# Patient Record
Sex: Female | Born: 1950
Health system: Southern US, Community
[De-identification: ages and names within clinical notes are randomized; demographics above are authoritative.]

## PROBLEM LIST (undated history)

## (undated) DIAGNOSIS — M8580 Other specified disorders of bone density and structure, unspecified site: Secondary | ICD-10-CM

## (undated) DIAGNOSIS — F419 Anxiety disorder, unspecified: Secondary | ICD-10-CM

## (undated) DIAGNOSIS — R42 Dizziness and giddiness: Secondary | ICD-10-CM

## (undated) DIAGNOSIS — I509 Heart failure, unspecified: Secondary | ICD-10-CM

## (undated) DIAGNOSIS — I493 Ventricular premature depolarization: Secondary | ICD-10-CM

## (undated) DIAGNOSIS — I5022 Chronic systolic (congestive) heart failure: Secondary | ICD-10-CM

## (undated) DIAGNOSIS — J302 Other seasonal allergic rhinitis: Secondary | ICD-10-CM

## (undated) DIAGNOSIS — J449 Chronic obstructive pulmonary disease, unspecified: Secondary | ICD-10-CM

## (undated) DIAGNOSIS — K219 Gastro-esophageal reflux disease without esophagitis: Secondary | ICD-10-CM

## (undated) DIAGNOSIS — E079 Disorder of thyroid, unspecified: Secondary | ICD-10-CM

## (undated) DIAGNOSIS — F32A Depression, unspecified: Secondary | ICD-10-CM

## (undated) DIAGNOSIS — A159 Respiratory tuberculosis unspecified: Secondary | ICD-10-CM

## (undated) DIAGNOSIS — I011 Acute rheumatic endocarditis: Secondary | ICD-10-CM

## (undated) DIAGNOSIS — I10 Essential (primary) hypertension: Secondary | ICD-10-CM

## (undated) DIAGNOSIS — F329 Major depressive disorder, single episode, unspecified: Secondary | ICD-10-CM

## (undated) HISTORY — PX: BACK SURGERY: SHX140

## (undated) HISTORY — PX: APPENDECTOMY: SHX54

## (undated) HISTORY — PX: ABDOMINAL HYSTERECTOMY: SHX81

## (undated) HISTORY — PX: TONSILLECTOMY: SUR1361

---
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
## 2009-09-03 ENCOUNTER — Emergency Department: Payer: Self-pay | Admitting: Emergency Medicine

## 2017-05-11 HISTORY — PX: CARDIAC CATHETERIZATION: SHX172

## 2018-03-12 HISTORY — PX: CARDIAC ELECTROPHYSIOLOGY MAPPING AND ABLATION: SHX1292

## 2019-08-27 ENCOUNTER — Other Ambulatory Visit: Payer: Self-pay

## 2019-08-27 ENCOUNTER — Encounter: Payer: Self-pay | Admitting: Emergency Medicine

## 2019-08-27 ENCOUNTER — Ambulatory Visit
Admission: EM | Admit: 2019-08-27 | Discharge: 2019-08-27 | Disposition: A | Discharge status: Home / Self Care | Payer: Medicare HMO | Attending: Family Medicine | Admitting: Family Medicine

## 2019-08-27 DIAGNOSIS — H6021 Malignant otitis externa, right ear: Secondary | ICD-10-CM

## 2019-08-27 HISTORY — DX: Other specified disorders of bone density and structure, unspecified site: M85.80

## 2019-08-27 HISTORY — DX: Depression, unspecified: F32.A

## 2019-08-27 HISTORY — DX: Other seasonal allergic rhinitis: J30.2

## 2019-08-27 HISTORY — DX: Chronic systolic (congestive) heart failure: I50.22

## 2019-08-27 HISTORY — DX: Anxiety disorder, unspecified: F41.9

## 2019-08-27 HISTORY — DX: Chronic obstructive pulmonary disease, unspecified: J44.9

## 2019-08-27 HISTORY — DX: Acute rheumatic endocarditis: I01.1

## 2019-08-27 HISTORY — DX: Ventricular premature depolarization: I49.3

## 2019-08-27 HISTORY — DX: Disorder of thyroid, unspecified: E07.9

## 2019-08-27 MED ORDER — NEOMYCIN-POLYMYXIN-HC 3.5-10000-1 OT SUSP: 4.0000 [drp] | Freq: Three times a day (TID) | OTIC | 0 refills | Status: DC

## 2019-08-27 NOTE — ED Triage Notes (Signed)
Patient in today c/o right ear itchy rash x 1 week. Patient has been using Bactroban ointment to the rash.

## 2019-08-29 NOTE — ED Provider Notes (Signed)
[facility]    CSN: [id number] Arrival date & time: 08/27/19  1711      History   Chief Complaint Chief Complaint  Patient presents with  . Ear Problem    HPI Maureen Hampton is a 68 y.o. female.   68 yo female with a c/o right ear discomfort, pain and drainage for the past week. Denies any fevers, chills, injuries.      Past Medical History:  Diagnosis Date  . Anxiety   . COPD (chronic obstructive pulmonary disease) (HCC)   . Depression   . Osteopenia   . PVC (premature ventricular contraction)   . Rheumatoid aortitis   . Seasonal allergies   . Systolic heart failure, chronic (HCC)   . Thyroid disease     There are no active problems to display for this patient.   Past Surgical History:  Procedure Laterality Date  . ABDOMINAL HYSTERECTOMY    . APPENDECTOMY    . BACK SURGERY    . CARDIAC ELECTROPHYSIOLOGY MAPPING AND ABLATION    . TONSILLECTOMY      OB History   No obstetric history on file.      Home Medications    Prior to Admission medications   Medication Sig Start Date End Date Taking? Authorizing Provider  albuterol (VENTOLIN HFA) 108 (90 Base) MCG/ACT inhaler Inhale into the lungs. 09/24/18  Yes [provider]  alendronate (FOSAMAX) 70 MG tablet Take by mouth. 07/01/18  Yes [provider]  baclofen (LIORESAL) 10 MG tablet TAKE 1 TABLET THREE TIMES DAILY AS NEEDED FOR MUSCLE SPASM(S) 07/27/19  Yes [provider]  cetirizine (ZYRTEC) 10 MG tablet Take by mouth. 04/01/19 03/31/20 Yes [provider]  diclofenac sodium (VOLTAREN) 1 % GEL Apply topically. 09/02/18 09/02/19 Yes [provider]  fluticasone (FLONASE) 50 MCG/ACT nasal spray Frequency:PRN   Dosage:0.0     Instructions:  Note:Dose: 2SPRY EACH NOSTIRL 04/23/11  Yes [provider]  folic acid (FOLVITE) 1 MG tablet Take by mouth. 09/02/18  Yes [provider]  furosemide (LASIX) 20 MG tablet Take by mouth. 07/09/18  Yes  [provider]  gabapentin (NEURONTIN) 300 MG capsule TAKE 2 CAPSULES EVERY NIGHT 03/11/19  Yes [provider]  levothyroxine (SYNTHROID) 112 MCG tablet Take by mouth. 06/18/19 12/15/19 Yes [provider]  lisinopril (ZESTRIL) 5 MG tablet TAKE 1 TABLET BY MOUTH EVERY DAY 07/27/19  Yes [provider]  methotrexate (RHEUMATREX) 2.5 MG tablet TAKE 8 TABLETS ONE TIME WEEKLY 01/15/19  Yes [provider]  metoprolol succinate (TOPROL-XL) 25 MG 24 hr tablet Take by mouth. 12/03/18 12/03/19 Yes [provider]  omeprazole (PRILOSEC) 20 MG capsule TAKE 1 CAPSULE EVERY DAY 03/11/19  Yes [provider]  sertraline (ZOLOFT) 100 MG tablet TAKE 1 AND 1/2 TABLETS EVERY DAY 03/11/19  Yes [provider]  Spacer/Aero-Holding Chambers (AEROCHAMBER MV) inhaler by Does not apply route. 11/19/18  Yes [provider]  budesonide-formoterol (SYMBICORT) 160-4.5 MCG/ACT inhaler Inhale into the lungs.    [provider]  neomycin-polymyxin-hydrocortisone (CORTISPORIN) 3.5-10000-1 OTIC suspension Place 4 drops into the right ear 3 (three) times daily. 08/27/19   Payton Mccallum, MD    Family History Family History  Family history unknown: Yes    Social History Social History   Tobacco Use  . Smoking status: Former Smoker    Quit date: 08/26/1994    Years since quitting: 25.0  . Smokeless tobacco: Never Used  Substance Use Topics  .  Alcohol use: Never    Frequency: Never  . Drug use: Never     Allergies   Penicillins and Codeine   Review of Systems Review of Systems   Physical Exam Triage Vital Signs ED Triage Vitals  Enc Vitals Group     BP 08/27/19 1726 (!) 120/47     Pulse Rate 08/27/19 1726 (!) 47     Resp 08/27/19 1726 18     Temp 08/27/19 1726 98.2 F (36.8 C)     Temp Source 08/27/19 1726 Oral     SpO2 08/27/19 1726 98 %     Weight 08/27/19 1725 146 lb (66.2 kg)     Height 08/27/19 1725 5\' 5"  (1.651 m)      Head Circumference --      Peak Flow --      Pain Score 08/27/19 1725 7     Pain Loc --      Pain Edu? --      Excl. in [place]? --    No data found.  Updated Vital Signs BP (!) 120/47 (BP Location: Left Arm)   Pulse (!) 47   Temp 98.2 F (36.8 C) (Oral)   Resp 18   Ht 5\' 5"  (1.651 m)   Wt 66.2 kg   SpO2 98%   BMI 24.30 kg/m   Visual Acuity Right Eye Distance:   Left Eye Distance:   Bilateral Distance:    Right Eye Near:   Left Eye Near:    Bilateral Near:     Physical Exam Vitals signs and nursing note reviewed.  Constitutional:      General: She is not in acute distress.    Appearance: She is not toxic-appearing or diaphoretic.  HENT:     Right Ear: Drainage and swelling present.     Left Ear: Tympanic membrane, ear canal and external ear normal.     Ears:     Comments: Right ear canal swollen and erythematous with drainage Neurological:     Mental Status: She is alert.      UC Treatments / Results  Labs (all labs ordered are listed, but only abnormal results are displayed) Labs Reviewed - No data to display  EKG   Radiology No results found.  Procedures Procedures (including critical care time)  Medications Ordered in UC Medications - No data to display  Initial Impression / Assessment and Plan / UC Course  I have reviewed the triage vital signs and the nursing notes.  Pertinent labs & imaging results that were available during my care of the patient were reviewed by me and considered in my medical decision making (see chart for details).      Final Clinical Impressions(s) / UC Diagnoses   Final diagnoses:  Acute malignant otitis externa of right ear    ED Prescriptions    Medication Sig Dispense Auth. Provider   neomycin-polymyxin-hydrocortisone (CORTISPORIN) 3.5-10000-1 OTIC suspension Place 4 drops into the right ear 3 (three) times daily. 10 mL Norval Gable, MD      1. diagnosis reviewed with patient 2. rx as per orders above;  reviewed possible side effects, interactions, risks and benefits  3.Follow-up prn if symptoms worsen or don't improve  PDMP not reviewed this encounter.   Norval Gable, MD 08/29/19 1714

## 2021-01-16 DIAGNOSIS — R0600 Dyspnea, unspecified: Secondary | ICD-10-CM | POA: Diagnosis not present

## 2021-02-12 DIAGNOSIS — R0602 Shortness of breath: Secondary | ICD-10-CM | POA: Diagnosis not present

## 2021-02-12 DIAGNOSIS — J45909 Unspecified asthma, uncomplicated: Secondary | ICD-10-CM | POA: Diagnosis not present

## 2021-04-06 DIAGNOSIS — I5022 Chronic systolic (congestive) heart failure: Secondary | ICD-10-CM | POA: Diagnosis not present

## 2021-05-01 DIAGNOSIS — E034 Atrophy of thyroid (acquired): Secondary | ICD-10-CM | POA: Diagnosis not present

## 2021-05-01 DIAGNOSIS — R059 Cough, unspecified: Secondary | ICD-10-CM | POA: Diagnosis not present

## 2021-05-07 DIAGNOSIS — Z0100 Encounter for examination of eyes and vision without abnormal findings: Secondary | ICD-10-CM | POA: Diagnosis not present

## 2021-05-07 DIAGNOSIS — H2513 Age-related nuclear cataract, bilateral: Secondary | ICD-10-CM | POA: Diagnosis not present

## 2021-05-31 DIAGNOSIS — H2511 Age-related nuclear cataract, right eye: Secondary | ICD-10-CM | POA: Diagnosis not present

## 2021-06-25 DIAGNOSIS — H2511 Age-related nuclear cataract, right eye: Secondary | ICD-10-CM | POA: Diagnosis not present

## 2021-06-25 DIAGNOSIS — H2512 Age-related nuclear cataract, left eye: Secondary | ICD-10-CM | POA: Diagnosis not present

## 2021-06-27 DIAGNOSIS — M25561 Pain in right knee: Secondary | ICD-10-CM | POA: Diagnosis not present

## 2021-06-27 DIAGNOSIS — M8580 Other specified disorders of bone density and structure, unspecified site: Secondary | ICD-10-CM | POA: Diagnosis not present

## 2021-06-27 DIAGNOSIS — M79641 Pain in right hand: Secondary | ICD-10-CM | POA: Diagnosis not present

## 2021-06-27 DIAGNOSIS — Z880 Allergy status to penicillin: Secondary | ICD-10-CM | POA: Diagnosis not present

## 2021-06-27 DIAGNOSIS — M47816 Spondylosis without myelopathy or radiculopathy, lumbar region: Secondary | ICD-10-CM | POA: Diagnosis not present

## 2021-06-27 DIAGNOSIS — M1611 Unilateral primary osteoarthritis, right hip: Secondary | ICD-10-CM | POA: Diagnosis not present

## 2021-06-27 DIAGNOSIS — Z5181 Encounter for therapeutic drug level monitoring: Secondary | ICD-10-CM | POA: Diagnosis not present

## 2021-06-27 DIAGNOSIS — M069 Rheumatoid arthritis, unspecified: Secondary | ICD-10-CM | POA: Diagnosis not present

## 2021-06-27 DIAGNOSIS — Z79899 Other long term (current) drug therapy: Secondary | ICD-10-CM | POA: Diagnosis not present

## 2021-06-27 DIAGNOSIS — E034 Atrophy of thyroid (acquired): Secondary | ICD-10-CM | POA: Diagnosis not present

## 2021-06-27 DIAGNOSIS — Z7983 Long term (current) use of bisphosphonates: Secondary | ICD-10-CM | POA: Diagnosis not present

## 2021-06-27 DIAGNOSIS — M059 Rheumatoid arthritis with rheumatoid factor, unspecified: Secondary | ICD-10-CM | POA: Diagnosis not present

## 2021-06-27 DIAGNOSIS — M25562 Pain in left knee: Secondary | ICD-10-CM | POA: Diagnosis not present

## 2021-06-27 DIAGNOSIS — Z780 Asymptomatic menopausal state: Secondary | ICD-10-CM | POA: Diagnosis not present

## 2021-06-27 DIAGNOSIS — Z885 Allergy status to narcotic agent status: Secondary | ICD-10-CM | POA: Diagnosis not present

## 2021-07-02 ENCOUNTER — Encounter: Payer: Self-pay | Admitting: Ophthalmology

## 2021-07-05 NOTE — Discharge Instructions (Signed)

## 2021-07-10 ENCOUNTER — Encounter
Admission: RE | Disposition: A | Discharge status: Home / Self Care | Payer: Self-pay | Source: Home / Self Care | Attending: Ophthalmology

## 2021-07-10 ENCOUNTER — Other Ambulatory Visit: Payer: Self-pay

## 2021-07-10 ENCOUNTER — Encounter: Payer: Self-pay | Admitting: Ophthalmology

## 2021-07-10 ENCOUNTER — Ambulatory Visit
Admission: RE | Admit: 2021-07-10 | Discharge: 2021-07-10 | Disposition: A | Discharge status: Home / Self Care | Payer: Medicare HMO | Attending: Ophthalmology | Admitting: Ophthalmology

## 2021-07-10 ENCOUNTER — Ambulatory Visit: Payer: Medicare HMO | Admitting: Anesthesiology

## 2021-07-10 DIAGNOSIS — Z9049 Acquired absence of other specified parts of digestive tract: Secondary | ICD-10-CM | POA: Insufficient documentation

## 2021-07-10 DIAGNOSIS — Z7983 Long term (current) use of bisphosphonates: Secondary | ICD-10-CM | POA: Diagnosis not present

## 2021-07-10 DIAGNOSIS — E079 Disorder of thyroid, unspecified: Secondary | ICD-10-CM | POA: Insufficient documentation

## 2021-07-10 DIAGNOSIS — Z79890 Hormone replacement therapy (postmenopausal): Secondary | ICD-10-CM | POA: Insufficient documentation

## 2021-07-10 DIAGNOSIS — Z87891 Personal history of nicotine dependence: Secondary | ICD-10-CM | POA: Insufficient documentation

## 2021-07-10 DIAGNOSIS — Z90710 Acquired absence of both cervix and uterus: Secondary | ICD-10-CM | POA: Insufficient documentation

## 2021-07-10 DIAGNOSIS — I5022 Chronic systolic (congestive) heart failure: Secondary | ICD-10-CM | POA: Diagnosis not present

## 2021-07-10 DIAGNOSIS — Z885 Allergy status to narcotic agent status: Secondary | ICD-10-CM | POA: Diagnosis not present

## 2021-07-10 DIAGNOSIS — H2511 Age-related nuclear cataract, right eye: Secondary | ICD-10-CM | POA: Diagnosis not present

## 2021-07-10 DIAGNOSIS — J449 Chronic obstructive pulmonary disease, unspecified: Secondary | ICD-10-CM | POA: Diagnosis not present

## 2021-07-10 DIAGNOSIS — Z880 Allergy status to penicillin: Secondary | ICD-10-CM | POA: Diagnosis not present

## 2021-07-10 DIAGNOSIS — Z7951 Long term (current) use of inhaled steroids: Secondary | ICD-10-CM | POA: Insufficient documentation

## 2021-07-10 DIAGNOSIS — Z79899 Other long term (current) drug therapy: Secondary | ICD-10-CM | POA: Insufficient documentation

## 2021-07-10 DIAGNOSIS — H25811 Combined forms of age-related cataract, right eye: Secondary | ICD-10-CM | POA: Diagnosis not present

## 2021-07-10 HISTORY — DX: Gastro-esophageal reflux disease without esophagitis: K21.9

## 2021-07-10 HISTORY — DX: Respiratory tuberculosis unspecified: A15.9

## 2021-07-10 HISTORY — DX: Dizziness and giddiness: R42

## 2021-07-10 HISTORY — PX: CATARACT EXTRACTION W/PHACO: SHX586

## 2021-07-10 SURGERY — PHACOEMULSIFICATION, CATARACT, WITH IOL INSERTION
Anesthesia: Monitor Anesthesia Care | Site: Eye | Laterality: Right

## 2021-07-10 MED ORDER — SIGHTPATH DOSE#1 BSS IO SOLN
INTRAOCULAR | Status: DC | PRN
  Administered 2021-07-10: 15 mL via INTRAOCULAR

## 2021-07-10 MED ORDER — SIGHTPATH DOSE#1 NA CHONDROIT SULF-NA HYALURON 40-17 MG/ML IO SOLN
INTRAOCULAR | Status: DC | PRN
  Administered 2021-07-10: 1 mL via INTRAOCULAR

## 2021-07-10 MED ORDER — BRIMONIDINE TARTRATE-TIMOLOL 0.2-0.5 % OP SOLN
OPHTHALMIC | Status: DC | PRN
  Administered 2021-07-10: 1 [drp] via OPHTHALMIC

## 2021-07-10 MED ORDER — ACETAMINOPHEN 160 MG/5ML PO SOLN: 325.0000 mg | Freq: Once | ORAL | Status: DC

## 2021-07-10 MED ORDER — MIDAZOLAM HCL 2 MG/2ML IJ SOLN
INTRAMUSCULAR | Status: DC | PRN
  Administered 2021-07-10: 1 mg via INTRAVENOUS

## 2021-07-10 MED ORDER — LACTATED RINGERS IV SOLN: INTRAVENOUS | Status: DC

## 2021-07-10 MED ORDER — MOXIFLOXACIN HCL 0.5 % OP SOLN
OPHTHALMIC | Status: DC | PRN
  Administered 2021-07-10: 0.2 mL via OPHTHALMIC

## 2021-07-10 MED ORDER — ACETAMINOPHEN 325 MG PO TABS: 325.0000 mg | ORAL_TABLET | Freq: Once | ORAL | Status: DC

## 2021-07-10 MED ORDER — SIGHTPATH DOSE#1 BSS IO SOLN
INTRAOCULAR | Status: DC | PRN
  Administered 2021-07-10: 2 mL

## 2021-07-10 MED ORDER — PHENYLEPHRINE HCL 10 % OP SOLN
1.0000 [drp] | OPHTHALMIC | Status: AC
  Administered 2021-07-10 (×3): 1 [drp] via OPHTHALMIC

## 2021-07-10 MED ORDER — SIGHTPATH DOSE#1 BSS IO SOLN
INTRAOCULAR | Status: DC | PRN
  Administered 2021-07-10: 55 mL via OPHTHALMIC

## 2021-07-10 MED ORDER — FENTANYL CITRATE (PF) 100 MCG/2ML IJ SOLN
INTRAMUSCULAR | Status: DC | PRN
  Administered 2021-07-10: 50 ug via INTRAVENOUS

## 2021-07-10 MED ORDER — CYCLOPENTOLATE HCL 2 % OP SOLN
1.0000 [drp] | OPHTHALMIC | Status: AC
  Administered 2021-07-10 (×3): 1 [drp] via OPHTHALMIC

## 2021-07-10 MED ORDER — TETRACAINE HCL 0.5 % OP SOLN
1.0000 [drp] | OPHTHALMIC | Status: DC | PRN
  Administered 2021-07-10 (×3): 1 [drp] via OPHTHALMIC

## 2021-07-10 SURGICAL SUPPLY — 15 items
CANNULA ANT/CHMB 27GA (MISCELLANEOUS) ×4 IMPLANT
GLOVE SURG ENC TEXT LTX SZ8 (GLOVE) ×2 IMPLANT
GLOVE SURG TRIUMPH 8.0 PF LTX (GLOVE) ×2 IMPLANT
GOWN STRL REUS W/ TWL LRG LVL3 (GOWN DISPOSABLE) ×2 IMPLANT
GOWN STRL REUS W/TWL LRG LVL3 (GOWN DISPOSABLE) ×4
KIT SLEEVE INFUSION .9 MICRO (MISCELLANEOUS) IMPLANT
LENS IOL TECNIS EYHANCE 22.5 (Intraocular Lens) ×2 IMPLANT
MARKER SKIN DUAL TIP RULER LAB (MISCELLANEOUS) ×2 IMPLANT
NEEDLE FILTER BLUNT 18X 1/2SAF (NEEDLE) ×1
NEEDLE FILTER BLUNT 18X1 1/2 (NEEDLE) ×1 IMPLANT
PACK EYE AFTER SURG (MISCELLANEOUS) ×2 IMPLANT
SYR 3ML LL SCALE MARK (SYRINGE) ×2 IMPLANT
SYR TB 1ML LUER SLIP (SYRINGE) ×2 IMPLANT
WATER STERILE IRR 250ML POUR (IV SOLUTION) ×2 IMPLANT
WIPE NON LINTING 3.25X3.25 (MISCELLANEOUS) ×2 IMPLANT

## 2021-07-10 NOTE — Anesthesia Preprocedure Evaluation (Signed)
Anesthesia Evaluation  Patient identified by MRN, date of birth, ID band Patient awake    Reviewed: Allergy & Precautions, H&P , NPO status , Patient's Chart, lab work & pertinent test results  Airway Mallampati: II  TM Distance: >3 FB Neck ROM: full    Dental no notable dental hx.    Pulmonary COPD, former smoker,    Pulmonary exam normal breath sounds clear to auscultation       Cardiovascular +CHF  Normal cardiovascular exam Rhythm:regular Rate:Normal     Neuro/Psych PSYCHIATRIC DISORDERS    GI/Hepatic GERD  ,  Endo/Other    Renal/GU      Musculoskeletal   Abdominal   Peds  Hematology   Anesthesia Other Findings   Reproductive/Obstetrics                             Anesthesia Physical Anesthesia Plan  ASA: 3  Anesthesia Plan: MAC   Post-op Pain Management:    Induction:   PONV Risk Score and Plan: 2 and Treatment may vary due to age or medical condition, TIVA and Midazolam  Airway Management Planned:   Additional Equipment:   Intra-op Plan:   Post-operative Plan:   Informed Consent: I have reviewed the patients History and Physical, chart, labs and discussed the procedure including the risks, benefits and alternatives for the proposed anesthesia with the patient or authorized representative who has indicated his/her understanding and acceptance.     Dental Advisory Given  Plan Discussed with: CRNA  Anesthesia Plan Comments:         Anesthesia Quick Evaluation

## 2021-07-10 NOTE — Transfer of Care (Signed)
Immediate Anesthesia Transfer of Care Note  Patient: Maureen Hampton  Procedure(s) Performed: CATARACT EXTRACTION PHACO AND INTRAOCULAR LENS PLACEMENT (IOC) RIGHT (Right)  Patient Location: PACU  Anesthesia Type: MAC  Level of Consciousness: awake, alert  and patient cooperative  Airway and Oxygen Therapy: Patient Spontanous Breathing and Patient connected to supplemental oxygen  Post-op Assessment: Post-op Vital signs reviewed, Patient's Cardiovascular Status Stable, Respiratory Function Stable, Patent Airway and No signs of Nausea or vomiting  Post-op Vital Signs: Reviewed and stable  Complications: No notable events documented.

## 2021-07-10 NOTE — Op Note (Signed)
PREOPERATIVE DIAGNOSIS:  Nuclear sclerotic cataract of the right eye.   POSTOPERATIVE DIAGNOSIS:  Cataract   OPERATIVE PROCEDURE:ORPROCALL@   SURGEON:  Maureen Manila, MD.   ANESTHESIA:  Anesthesiologist: Ranee Gosselin, MD CRNA: Michaele Offer, CRNA; Maree Krabbe, CRNA  1.      Managed anesthesia care. 2.      0.42ml of Shugarcaine was instilled in the eye following the paracentesis.   COMPLICATIONS:  None.   TECHNIQUE:   Stop and chop   DESCRIPTION OF PROCEDURE:  The patient was examined and consented in the preoperative holding area where the aforementioned topical anesthesia was applied to the right eye and then brought back to the Operating Room where the right eye was prepped and draped in the usual sterile ophthalmic fashion and a lid speculum was placed. A paracentesis was created with the side port blade and the anterior chamber was filled with viscoelastic. A near clear corneal incision was performed with the steel keratome. A continuous curvilinear capsulorrhexis was performed with a cystotome followed by the capsulorrhexis forceps. Hydrodissection and hydrodelineation were carried out with BSS on a blunt cannula. The lens was removed in a stop and chop  technique and the remaining cortical material was removed with the irrigation-aspiration handpiece. The capsular bag was inflated with viscoelastic and the Technis ZCB00  lens was placed in the capsular bag without complication. The remaining viscoelastic was removed from the eye with the irrigation-aspiration handpiece. The wounds were hydrated. The anterior chamber was flushed with BSS and the eye was inflated to physiologic pressure. 0.15ml of Vigamox was placed in the anterior chamber. The wounds were found to be water tight. The eye was dressed with Combigan. The patient was given protective glasses to wear throughout the day and a shield with which to sleep tonight. The patient was also given drops with which to begin a drop  regimen today and will follow-up with me in one day. Implant Name Type Inv. Item Serial No. Manufacturer Lot No. LRB No. Used Action  LENS IOL TECNIS EYHANCE 22.5 - [id number] Intraocular Lens LENS IOL TECNIS EYHANCE 22.5 [id number] JOHNSON   Right 1 Implanted   Procedure(s): CATARACT EXTRACTION PHACO AND INTRAOCULAR LENS PLACEMENT (IOC) RIGHT 7.69 00:47.2 (Right)  Electronically signed: Galen Hampton 07/10/2021 9:48 AM

## 2021-07-10 NOTE — H&P (Signed)
[facility]   Primary Care Physician:  [facility] Ophthalmologist: Dr. Druscilla Brownie  Pre-Procedure History & Physical: HPI:  Maureen Hampton is a 70 y.o. female here for cataract surgery.   Past Medical History:  Diagnosis Date   Anxiety    COPD (chronic obstructive pulmonary disease) (HCC)    Depression    GERD (gastroesophageal reflux disease)    Osteopenia    PVC (premature ventricular contraction)    Rheumatoid aortitis    Seasonal allergies    Systolic heart failure, chronic (HCC)    Thyroid disease    Tuberculosis    as child. received tx.   Vertigo    no episodes for several months    Past Surgical History:  Procedure Laterality Date   ABDOMINAL HYSTERECTOMY     APPENDECTOMY     BACK SURGERY     L5   CARDIAC CATHETERIZATION  05/2017   CARDIAC ELECTROPHYSIOLOGY MAPPING AND ABLATION  03/12/2018   TONSILLECTOMY      Prior to Admission medications   Medication Sig Start Date End Date Taking? Authorizing Provider  albuterol (VENTOLIN HFA) 108 (90 Base) MCG/ACT inhaler Inhale into the lungs. 09/24/18  Yes [provider]  alendronate (FOSAMAX) 70 MG tablet Take by mouth. 07/01/18  Yes [provider]  baclofen (LIORESAL) 10 MG tablet TAKE 1 TABLET THREE TIMES DAILY AS NEEDED FOR MUSCLE SPASM(S) 07/27/19  Yes [provider]  budesonide-formoterol (SYMBICORT) 160-4.5 MCG/ACT inhaler Inhale into the lungs.   Yes [provider]  fluticasone (FLONASE) 50 MCG/ACT nasal spray Frequency:PRN   Dosage:0.0     Instructions:  Note:Dose: 2SPRY EACH NOSTIRL 04/23/11  Yes [provider]  folic acid (FOLVITE) 1 MG tablet Take by mouth. 09/02/18  Yes [provider]  furosemide (LASIX) 20 MG tablet Take by mouth. 07/09/18  Yes [provider]  gabapentin (NEURONTIN) 300 MG capsule TAKE 2 CAPSULES EVERY NIGHT 03/11/19  Yes [provider]  levothyroxine (SYNTHROID) 112 MCG tablet Take by mouth. 06/18/19 07/10/21  Yes [provider]  lisinopril (ZESTRIL) 5 MG tablet TAKE 1 TABLET BY MOUTH EVERY DAY 07/27/19  Yes [provider]  methotrexate (RHEUMATREX) 2.5 MG tablet TAKE 8 TABLETS ONE TIME WEEKLY 01/15/19  Yes [provider]  metoprolol succinate (TOPROL-XL) 25 MG 24 hr tablet Take by mouth. 12/03/18 07/10/21 Yes [provider]  omeprazole (PRILOSEC) 20 MG capsule TAKE 1 CAPSULE EVERY DAY 03/11/19  Yes [provider]  sertraline (ZOLOFT) 100 MG tablet TAKE 1 AND 1/2 TABLETS EVERY DAY 03/11/19  Yes [provider]  Spacer/Aero-Holding Chambers (AEROCHAMBER MV) inhaler by Does not apply route. 11/19/18  Yes [provider]  cetirizine (ZYRTEC) 10 MG tablet Take by mouth. 04/01/19 03/31/20  [provider]  neomycin-polymyxin-hydrocortisone (CORTISPORIN) 3.5-10000-1 OTIC suspension Place 4 drops into the right ear 3 (three) times daily. 08/27/19   Payton Mccallum, MD    Allergies as of 06/01/2021 - Review Complete 08/27/2019  Allergen Reaction Noted   Penicillins Hives 03/17/2013   Codeine Nausea Only 03/17/2013    Family History  Family history unknown: Yes    Social History   Socioeconomic History   Marital status: Married    Spouse name: Not on file   Number of children: Not on file   Years of education: Not on file   Highest education level: Not on file  Occupational History   Not on file  Tobacco Use   Smoking status: Former    Types: Cigarettes  Quit date: 08/26/1994    Years since quitting: 26.8   Smokeless tobacco: Never  Vaping Use   Vaping Use: Never used  Substance and Sexual Activity   Alcohol use: Never   Drug use: Never   Sexual activity: Not on file  Other Topics Concern   Not on file  Social History Narrative   Not on file   Social Determinants of Health   Financial Resource Strain: Not on file  Food Insecurity: Not on file  Transportation Needs: Not on file  Physical Activity: Not on file   Stress: Not on file  Social Connections: Not on file  Intimate Partner Violence: Not on file    Review of Systems: See HPI, otherwise negative ROS  Physical Exam: Ht 5\' 5"  (1.651 m)   Wt 63.5 kg   BMI 23.30 kg/m  General:   Alert, cooperative in NAD Head:  Normocephalic and atraumatic. Respiratory:  Normal work of breathing. Cardiovascular:  RRR  Impression/Plan: Maureen Hampton is here for cataract surgery.  Risks, benefits, limitations, and alternatives regarding cataract surgery have been reviewed with the patient.  Questions have been answered.  All parties agreeable.   Sharlynn Oliphant, MD  07/10/2021, 9:22 AM

## 2021-07-10 NOTE — Anesthesia Postprocedure Evaluation (Signed)
Anesthesia Post Note  Patient: Maureen Hampton  Procedure(s) Performed: CATARACT EXTRACTION PHACO AND INTRAOCULAR LENS PLACEMENT (IOC) RIGHT 7.69 00:47.2 (Right: Eye)     Patient location during evaluation: PACU Anesthesia Type: MAC Level of consciousness: awake and alert and oriented Pain management: satisfactory to patient Vital Signs Assessment: post-procedure vital signs reviewed and stable Respiratory status: spontaneous breathing, nonlabored ventilation and respiratory function stable Cardiovascular status: blood pressure returned to baseline and stable Postop Assessment: Adequate PO intake and No signs of nausea or vomiting Anesthetic complications: no   No notable events documented.  Raliegh Ip

## 2021-07-11 ENCOUNTER — Encounter: Payer: Self-pay | Admitting: Ophthalmology

## 2021-07-17 ENCOUNTER — Encounter: Payer: Self-pay | Admitting: Ophthalmology

## 2021-07-17 DIAGNOSIS — I5022 Chronic systolic (congestive) heart failure: Secondary | ICD-10-CM | POA: Diagnosis not present

## 2021-07-18 DIAGNOSIS — H2512 Age-related nuclear cataract, left eye: Secondary | ICD-10-CM | POA: Diagnosis not present

## 2021-07-20 NOTE — Discharge Instructions (Signed)

## 2021-07-24 ENCOUNTER — Ambulatory Visit: Payer: Medicare HMO | Admitting: Anesthesiology

## 2021-07-24 ENCOUNTER — Encounter
Admission: RE | Disposition: A | Discharge status: Home / Self Care | Payer: Self-pay | Source: Home / Self Care | Attending: Ophthalmology

## 2021-07-24 ENCOUNTER — Encounter: Payer: Self-pay | Admitting: Ophthalmology

## 2021-07-24 ENCOUNTER — Other Ambulatory Visit: Payer: Self-pay

## 2021-07-24 ENCOUNTER — Ambulatory Visit
Admission: RE | Admit: 2021-07-24 | Discharge: 2021-07-24 | Disposition: A | Discharge status: Home / Self Care | Payer: Medicare HMO | Attending: Ophthalmology | Admitting: Ophthalmology

## 2021-07-24 DIAGNOSIS — H2512 Age-related nuclear cataract, left eye: Secondary | ICD-10-CM | POA: Diagnosis not present

## 2021-07-24 DIAGNOSIS — Z885 Allergy status to narcotic agent status: Secondary | ICD-10-CM | POA: Insufficient documentation

## 2021-07-24 DIAGNOSIS — H25812 Combined forms of age-related cataract, left eye: Secondary | ICD-10-CM | POA: Diagnosis not present

## 2021-07-24 DIAGNOSIS — Z79899 Other long term (current) drug therapy: Secondary | ICD-10-CM | POA: Diagnosis not present

## 2021-07-24 DIAGNOSIS — Z87891 Personal history of nicotine dependence: Secondary | ICD-10-CM | POA: Diagnosis not present

## 2021-07-24 DIAGNOSIS — Z79890 Hormone replacement therapy (postmenopausal): Secondary | ICD-10-CM | POA: Insufficient documentation

## 2021-07-24 DIAGNOSIS — Z880 Allergy status to penicillin: Secondary | ICD-10-CM | POA: Diagnosis not present

## 2021-07-24 DIAGNOSIS — Z7983 Long term (current) use of bisphosphonates: Secondary | ICD-10-CM | POA: Diagnosis not present

## 2021-07-24 DIAGNOSIS — Z7951 Long term (current) use of inhaled steroids: Secondary | ICD-10-CM | POA: Insufficient documentation

## 2021-07-24 HISTORY — PX: CATARACT EXTRACTION W/PHACO: SHX586

## 2021-07-24 SURGERY — PHACOEMULSIFICATION, CATARACT, WITH IOL INSERTION
Anesthesia: Monitor Anesthesia Care | Site: Eye | Laterality: Left

## 2021-07-24 MED ORDER — LACTATED RINGERS IV SOLN: INTRAVENOUS | Status: DC

## 2021-07-24 MED ORDER — SIGHTPATH DOSE#1 BSS IO SOLN
INTRAOCULAR | Status: DC | PRN
  Administered 2021-07-24: 15 mL

## 2021-07-24 MED ORDER — ARMC OPHTHALMIC DILATING DROPS
1.0000 "application " | OPHTHALMIC | Status: DC | PRN
  Administered 2021-07-24 (×3): 1 via OPHTHALMIC

## 2021-07-24 MED ORDER — MIDAZOLAM HCL 2 MG/2ML IJ SOLN
INTRAMUSCULAR | Status: DC | PRN
  Administered 2021-07-24: 1 mg via INTRAVENOUS

## 2021-07-24 MED ORDER — MOXIFLOXACIN HCL 0.5 % OP SOLN
OPHTHALMIC | Status: DC | PRN
  Administered 2021-07-24: 0.2 mL via OPHTHALMIC

## 2021-07-24 MED ORDER — FENTANYL CITRATE (PF) 100 MCG/2ML IJ SOLN
INTRAMUSCULAR | Status: DC | PRN
  Administered 2021-07-24: 50 ug via INTRAVENOUS

## 2021-07-24 MED ORDER — SIGHTPATH DOSE#1 NA CHONDROIT SULF-NA HYALURON 40-17 MG/ML IO SOLN
INTRAOCULAR | Status: DC | PRN
  Administered 2021-07-24: 1 mL via INTRAOCULAR

## 2021-07-24 MED ORDER — BRIMONIDINE TARTRATE-TIMOLOL 0.2-0.5 % OP SOLN
OPHTHALMIC | Status: DC | PRN
  Administered 2021-07-24: 1 [drp] via OPHTHALMIC

## 2021-07-24 MED ORDER — SIGHTPATH DOSE#1 BSS IO SOLN
INTRAOCULAR | Status: DC | PRN
  Administered 2021-07-24: 1 mL via INTRAMUSCULAR

## 2021-07-24 MED ORDER — SIGHTPATH DOSE#1 BSS IO SOLN
INTRAOCULAR | Status: DC | PRN
  Administered 2021-07-24: 46 mL via OPHTHALMIC

## 2021-07-24 MED ORDER — TETRACAINE HCL 0.5 % OP SOLN
1.0000 [drp] | OPHTHALMIC | Status: DC | PRN
  Administered 2021-07-24 (×3): 1 [drp] via OPHTHALMIC

## 2021-07-24 SURGICAL SUPPLY — 16 items
CANNULA ANT/CHMB 27GA (MISCELLANEOUS) ×4 IMPLANT
CARTRIDGE ABBOTT (MISCELLANEOUS) ×2 IMPLANT
GLOVE SURG ENC TEXT LTX SZ8 (GLOVE) ×2 IMPLANT
GLOVE SURG TRIUMPH 8.0 PF LTX (GLOVE) ×2 IMPLANT
GOWN STRL REUS W/ TWL LRG LVL3 (GOWN DISPOSABLE) ×2 IMPLANT
GOWN STRL REUS W/TWL LRG LVL3 (GOWN DISPOSABLE) ×4
LENS IOL TECNIS EYHANCE 22.5 (Intraocular Lens) ×2 IMPLANT
MARKER SKIN DUAL TIP RULER LAB (MISCELLANEOUS) ×2 IMPLANT
NEEDLE FILTER BLUNT 18X 1/2SAF (NEEDLE) ×1
NEEDLE FILTER BLUNT 18X1 1/2 (NEEDLE) ×1 IMPLANT
PACK EYE AFTER SURG (MISCELLANEOUS) ×2 IMPLANT
SYR 3ML LL SCALE MARK (SYRINGE) ×2 IMPLANT
SYR TB 1ML LUER SLIP (SYRINGE) ×2 IMPLANT
TIP ITREPID SGL USE BENT I/A (SUCTIONS) ×2 IMPLANT
WATER STERILE IRR 250ML POUR (IV SOLUTION) ×2 IMPLANT
WIPE NON LINTING 3.25X3.25 (MISCELLANEOUS) ×2 IMPLANT

## 2021-07-24 NOTE — H&P (Signed)
[facility]   Primary Care Physician:  [facility] Ophthalmologist: Dr.Aryanne Gilleland  Pre-Procedure History & Physical: HPI:  Maureen Hampton is a 70 y.o. female here for cataract surgery.   Past Medical History:  Diagnosis Date   Anxiety    COPD (chronic obstructive pulmonary disease) (HCC)    Depression    GERD (gastroesophageal reflux disease)    Osteopenia    PVC (premature ventricular contraction)    Rheumatoid aortitis    Seasonal allergies    Systolic heart failure, chronic (HCC)    Thyroid disease    Tuberculosis    as child. received tx.   Vertigo    no episodes for several months    Past Surgical History:  Procedure Laterality Date   ABDOMINAL HYSTERECTOMY     APPENDECTOMY     BACK SURGERY     L5   CARDIAC CATHETERIZATION  05/2017   CARDIAC ELECTROPHYSIOLOGY MAPPING AND ABLATION  03/12/2018   CATARACT EXTRACTION W/PHACO Right 07/10/2021   Procedure: CATARACT EXTRACTION PHACO AND INTRAOCULAR LENS PLACEMENT (IOC) RIGHT 7.69 00:47.2;  Surgeon: Galen Manila, MD;  Location: [facility];  Service: Ophthalmology;  Laterality: Right;   TONSILLECTOMY      Prior to Admission medications   Medication Sig Start Date End Date Taking? Authorizing Provider  albuterol (VENTOLIN HFA) 108 (90 Base) MCG/ACT inhaler Inhale into the lungs. 09/24/18  Yes [provider]  alendronate (FOSAMAX) 70 MG tablet Take by mouth. 07/01/18  Yes [provider]  baclofen (LIORESAL) 10 MG tablet TAKE 1 TABLET THREE TIMES DAILY AS NEEDED FOR MUSCLE SPASM(S) 07/27/19  Yes [provider]  budesonide-formoterol (SYMBICORT) 160-4.5 MCG/ACT inhaler Inhale into the lungs.   Yes [provider]  fluticasone (FLONASE) 50 MCG/ACT nasal spray Frequency:PRN   Dosage:0.0     Instructions:  Note:Dose: 2SPRY EACH NOSTIRL 04/23/11  Yes [provider]  folic acid (FOLVITE) 1 MG tablet Take by mouth. 09/02/18  Yes [provider]  furosemide  (LASIX) 20 MG tablet Take by mouth. 07/09/18  Yes [provider]  gabapentin (NEURONTIN) 300 MG capsule TAKE 2 CAPSULES EVERY NIGHT 03/11/19  Yes [provider]  lisinopril (ZESTRIL) 5 MG tablet TAKE 1 TABLET BY MOUTH EVERY DAY 07/27/19  Yes [provider]  methotrexate (RHEUMATREX) 2.5 MG tablet TAKE 8 TABLETS ONE TIME WEEKLY 01/15/19  Yes [provider]  metoprolol succinate (TOPROL-XL) 25 MG 24 hr tablet Take by mouth. 12/03/18 07/24/21 Yes [provider]  neomycin-polymyxin-hydrocortisone (CORTISPORIN) 3.5-10000-1 OTIC suspension Place 4 drops into the right ear 3 (three) times daily. 08/27/19  Yes Conty, Pamala Hurry, MD  omeprazole (PRILOSEC) 20 MG capsule TAKE 1 CAPSULE EVERY DAY 03/11/19  Yes [provider]  sertraline (ZOLOFT) 100 MG tablet TAKE 1 AND 1/2 TABLETS EVERY DAY 03/11/19  Yes [provider]  Spacer/Aero-Holding Chambers (AEROCHAMBER MV) inhaler by Does not apply route. 11/19/18  Yes [provider]  cetirizine (ZYRTEC) 10 MG tablet Take by mouth. 04/01/19 03/31/20  [provider]  levothyroxine (SYNTHROID) 112 MCG tablet Take by mouth. 06/18/19 07/10/21  [provider]    Allergies as of 06/01/2021 - Review Complete 08/27/2019  Allergen Reaction Noted   Penicillins Hives 03/17/2013   Codeine Nausea Only 03/17/2013    Family History  Family history unknown: Yes    Social History   Socioeconomic History   Marital status: Married    Spouse name: Not on file   Number of children: Not on file  Years of education: Not on file   Highest education level: Not on file  Occupational History   Not on file  Tobacco Use   Smoking status: Former    Types: Cigarettes    Quit date: 08/26/1994    Years since quitting: 26.9   Smokeless tobacco: Never  Vaping Use   Vaping Use: Never used  Substance and Sexual Activity   Alcohol use: Never   Drug use: Never   Sexual activity: Not on file  Other  Topics Concern   Not on file  Social History Narrative   Not on file   Social Determinants of Health   Financial Resource Strain: Not on file  Food Insecurity: Not on file  Transportation Needs: Not on file  Physical Activity: Not on file  Stress: Not on file  Social Connections: Not on file  Intimate Partner Violence: Not on file    Review of Systems: See HPI, otherwise negative ROS  Physical Exam: BP (!) 109/39 (BP Location: Right Arm)   Temp 98.1 F (36.7 C) (Temporal)   Resp 16   Ht 5\' 5"  (1.651 m)   Wt 64.3 kg   SpO2 96%   BMI 23.60 kg/m  General:   Alert, cooperative in NAD Head:  Normocephalic and atraumatic. Respiratory:  Normal work of breathing. Cardiovascular:  RRR  Impression/Plan: Maureen Hampton is here for cataract surgery.  Risks, benefits, limitations, and alternatives regarding cataract surgery have been reviewed with the patient.  Questions have been answered.  All parties agreeable.   Maureen Oliphant, MD  07/24/2021, 10:03 AM

## 2021-07-24 NOTE — Anesthesia Procedure Notes (Signed)
Procedure Name: MAC Date/Time: 07/24/2021 10:12 AM Performed by: Cameron Ali, CRNA Pre-anesthesia Checklist: Patient identified, Emergency Drugs available, Suction available, Timeout performed and Patient being monitored Patient Re-evaluated:Patient Re-evaluated prior to induction Oxygen Delivery Method: Nasal cannula Placement Confirmation: positive ETCO2

## 2021-07-24 NOTE — Op Note (Signed)
PREOPERATIVE DIAGNOSIS:  Nuclear sclerotic cataract of the left eye.   POSTOPERATIVE DIAGNOSIS:  Nuclear sclerotic cataract of the left eye.   OPERATIVE PROCEDURE:ORPROCALL@   SURGEON:  Galen Manila, MD.   ANESTHESIA:  Anesthesiologist: Jolayne Panther, MD CRNA: Maree Krabbe, CRNA  1.      Managed anesthesia care. 2.     0.37ml of Shugarcaine was instilled following the paracentesis   COMPLICATIONS:  None.   TECHNIQUE:   Stop and chop   DESCRIPTION OF PROCEDURE:  The patient was examined and consented in the preoperative holding area where the aforementioned topical anesthesia was applied to the left eye and then brought back to the Operating Room where the left eye was prepped and draped in the usual sterile ophthalmic fashion and a lid speculum was placed. A paracentesis was created with the side port blade and the anterior chamber was filled with viscoelastic. A near clear corneal incision was performed with the steel keratome. A continuous curvilinear capsulorrhexis was performed with a cystotome followed by the capsulorrhexis forceps. Hydrodissection and hydrodelineation were carried out with BSS on a blunt cannula. The lens was removed in a stop and chop  technique and the remaining cortical material was removed with the irrigation-aspiration handpiece. The capsular bag was inflated with viscoelastic and the Technis [id number] lens was placed in the capsular bag without complication. The remaining viscoelastic was removed from the eye with the irrigation-aspiration handpiece. The wounds were hydrated. The anterior chamber was flushed with BSS and the eye was inflated to physiologic pressure. 0.42ml Vigamox was placed in the anterior chamber. The wounds were found to be water tight. The eye was dressed with Combigan. The patient was given protective glasses to wear throughout the day and a shield with which to sleep tonight. The patient was also given drops with which to begin a drop regimen  today and will follow-up with me in one day. Implant Name Type Inv. Item Serial No. Manufacturer Lot No. LRB No. Used Action  LENS IOL TECNIS EYHANCE 22.5 - [id number] Intraocular Lens LENS IOL TECNIS EYHANCE 22.5 [id number] JOHNSON   Left 1 Implanted    Procedure(s): CATARACT EXTRACTION PHACO AND INTRAOCULAR LENS PLACEMENT (IOC) LEFT 5.37 00:32.8 (Left)  Electronically signed: Galen Manila 07/24/2021 10:31 AM

## 2021-07-24 NOTE — Transfer of Care (Signed)
Immediate Anesthesia Transfer of Care Note  Patient: Maureen Hampton  Procedure(s) Performed: CATARACT EXTRACTION PHACO AND INTRAOCULAR LENS PLACEMENT (IOC) LEFT 5.37 00:32.8 (Left: Eye)  Patient Location: PACU  Anesthesia Type: MAC  Level of Consciousness: awake, alert  and patient cooperative  Airway and Oxygen Therapy: Patient Spontanous Breathing and Patient connected to supplemental oxygen  Post-op Assessment: Post-op Vital signs reviewed, Patient's Cardiovascular Status Stable, Respiratory Function Stable, Patent Airway and No signs of Nausea or vomiting  Post-op Vital Signs: Reviewed and stable  Complications: No notable events documented.

## 2021-07-24 NOTE — Anesthesia Preprocedure Evaluation (Signed)
Anesthesia Evaluation  Patient identified by MRN, date of birth, ID band Patient awake    Airway Mallampati: II  TM Distance: >3 FB Neck ROM: full    Dental no notable dental hx.    Pulmonary COPD, former smoker,    Pulmonary exam normal        Cardiovascular Normal cardiovascular exam  Cards appt 9/22:  Etiology of cardiomyopathy presumed secondary to prior significant PVC burden. Initial echocardiogram from 04/2017 revealed depressed LV systolic function (EF 68%), grade II diastolic dysfunction, moderately elevated PASP, mildly elevated RAP. She underwent LHC in 05/2017 which showed non-flow limiting CAD. ECG at that time (04/2017) showed frequent PVCs with lateral TWI, troponin negative x1. Since then, LV function has improved from echo on 10/2020 showing EF of 55-60% with the remainder of the study being stable from before. Reports improvement of shortness of breath. Euvolemic on exam today. Reports dry weight ~140 lbs which she is near today. - Continue Lasix 40 mg daily. Recommend her to weigh herself daily to keep track of any possible fluid accumulation, especially during periods where she is taken her Lasix less frequently.  - Continue metoprolol succinate 12.5 mg daily and lisinopril 5 mg daily    Neuro/Psych Anxiety negative neurological ROS     GI/Hepatic Neg liver ROS, Medicated,  Endo/Other  negative endocrine ROS  Renal/GU negative Renal ROS  negative genitourinary   Musculoskeletal   Abdominal   Peds  Hematology negative hematology ROS (+)   Anesthesia Other Findings   Reproductive/Obstetrics                             Anesthesia Physical Anesthesia Plan  ASA: 3  Anesthesia Plan: MAC   Post-op Pain Management:    Induction:   PONV Risk Score and Plan: 2 and Midazolam and TIVA  Airway Management Planned:   Additional Equipment:   Intra-op Plan:   Post-operative Plan:    Informed Consent: I have reviewed the patients History and Physical, chart, labs and discussed the procedure including the risks, benefits and alternatives for the proposed anesthesia with the patient or authorized representative who has indicated his/her understanding and acceptance.       Plan Discussed with:   Anesthesia Plan Comments:         Anesthesia Quick Evaluation

## 2021-07-24 NOTE — Anesthesia Postprocedure Evaluation (Signed)
Anesthesia Post Note  Patient: Maureen Hampton  Procedure(s) Performed: CATARACT EXTRACTION PHACO AND INTRAOCULAR LENS PLACEMENT (IOC) LEFT 5.37 00:32.8 (Left: Eye)     Patient location during evaluation: PACU Anesthesia Type: MAC Level of consciousness: awake and alert Pain management: pain level controlled Vital Signs Assessment: post-procedure vital signs reviewed and stable Respiratory status: spontaneous breathing Cardiovascular status: stable Anesthetic complications: no   No notable events documented.  Gillian Scarce

## 2021-07-25 ENCOUNTER — Encounter: Payer: Self-pay | Admitting: Ophthalmology

## 2021-08-30 DIAGNOSIS — Z1231 Encounter for screening mammogram for malignant neoplasm of breast: Secondary | ICD-10-CM | POA: Diagnosis not present

## 2021-08-30 DIAGNOSIS — I5022 Chronic systolic (congestive) heart failure: Secondary | ICD-10-CM | POA: Diagnosis not present

## 2021-08-30 DIAGNOSIS — R001 Bradycardia, unspecified: Secondary | ICD-10-CM | POA: Diagnosis not present

## 2021-08-30 DIAGNOSIS — J984 Other disorders of lung: Secondary | ICD-10-CM | POA: Diagnosis not present

## 2021-08-30 DIAGNOSIS — D72810 Lymphocytopenia: Secondary | ICD-10-CM | POA: Diagnosis not present

## 2021-08-30 DIAGNOSIS — G589 Mononeuropathy, unspecified: Secondary | ICD-10-CM | POA: Diagnosis not present

## 2021-08-30 DIAGNOSIS — E034 Atrophy of thyroid (acquired): Secondary | ICD-10-CM | POA: Diagnosis not present

## 2021-08-30 DIAGNOSIS — Z23 Encounter for immunization: Secondary | ICD-10-CM | POA: Diagnosis not present

## 2021-08-30 DIAGNOSIS — Z0000 Encounter for general adult medical examination without abnormal findings: Secondary | ICD-10-CM | POA: Diagnosis not present

## 2021-08-30 DIAGNOSIS — J439 Emphysema, unspecified: Secondary | ICD-10-CM | POA: Diagnosis not present

## 2021-10-25 DIAGNOSIS — N3000 Acute cystitis without hematuria: Secondary | ICD-10-CM | POA: Diagnosis not present

## 2021-11-30 DIAGNOSIS — R1011 Right upper quadrant pain: Secondary | ICD-10-CM | POA: Diagnosis not present

## 2021-12-03 DIAGNOSIS — R932 Abnormal findings on diagnostic imaging of liver and biliary tract: Secondary | ICD-10-CM | POA: Diagnosis not present

## 2021-12-03 DIAGNOSIS — K8020 Calculus of gallbladder without cholecystitis without obstruction: Secondary | ICD-10-CM | POA: Diagnosis not present

## 2022-01-10 ENCOUNTER — Ambulatory Visit (INDEPENDENT_AMBULATORY_CARE_PROVIDER_SITE_OTHER): Payer: Medicare HMO

## 2022-01-10 ENCOUNTER — Ambulatory Visit
Admission: EM | Admit: 2022-01-10 | Discharge: 2022-01-10 | Disposition: A | Discharge status: Home / Self Care | Payer: Medicare HMO | Attending: Emergency Medicine | Admitting: Emergency Medicine

## 2022-01-10 ENCOUNTER — Other Ambulatory Visit: Payer: Self-pay

## 2022-01-10 DIAGNOSIS — R059 Cough, unspecified: Secondary | ICD-10-CM

## 2022-01-10 DIAGNOSIS — M7989 Other specified soft tissue disorders: Secondary | ICD-10-CM | POA: Diagnosis not present

## 2022-01-10 DIAGNOSIS — J441 Chronic obstructive pulmonary disease with (acute) exacerbation: Secondary | ICD-10-CM | POA: Diagnosis not present

## 2022-01-10 DIAGNOSIS — S90122A Contusion of left lesser toe(s) without damage to nail, initial encounter: Secondary | ICD-10-CM

## 2022-01-10 DIAGNOSIS — M79672 Pain in left foot: Secondary | ICD-10-CM | POA: Diagnosis not present

## 2022-01-10 DIAGNOSIS — S9032XA Contusion of left foot, initial encounter: Secondary | ICD-10-CM | POA: Diagnosis not present

## 2022-01-10 HISTORY — DX: Essential (primary) hypertension: I10

## 2022-01-10 HISTORY — DX: Heart failure, unspecified: I50.9

## 2022-01-10 MED ORDER — LEVOFLOXACIN 500 MG PO TABS: 500.0000 mg | ORAL_TABLET | Freq: Every day | ORAL | 0 refills | Status: AC

## 2022-01-10 MED ORDER — BENZONATATE 100 MG PO CAPS: 200.0000 mg | ORAL_CAPSULE | Freq: Three times a day (TID) | ORAL | 0 refills | Status: AC

## 2022-01-10 MED ORDER — PROMETHAZINE-DM 6.25-15 MG/5ML PO SYRP: 5.0000 mL | ORAL_SOLUTION | Freq: Four times a day (QID) | ORAL | 0 refills | Status: AC | PRN

## 2022-01-10 NOTE — ED Triage Notes (Signed)
Pt reports rolled out of bed last night and hurt her left foot, foot landed on floor. Pain to left lateral side of foot. No bruising noted. Able to apply weight and ambulate to room with husband's cane. Slight swelling noted.  ? ?3+ pedal pulse to left foot.  ? ?Pt has used cool compress to foot.  ? ?Pt also c/o productive cough for 4 weeks. Pt has inhalers and hx of COPD ?

## 2022-01-10 NOTE — ED Provider Notes (Addendum)
[facility]    CSN: [id number] Arrival date & time: 01/10/22  1737      History   Chief Complaint Chief Complaint  Patient presents with   Left Foot Injury    HPI Maureen Hampton is a 71 y.o. female.   HPI  71 year old female here for evaluation of left foot pain.  Patient reports that she rolled out of bed last night and fell onto the floor striking her left foot.  She is having pain on the left side of her foot as well as at her left fifth toe.  There is some mild bruising to the edge of her left foot.  She is unable to bear weight fully but she can walk on her heel.  She is also complaining of numbness in her toes.  Past Medical History:  Diagnosis Date   Anxiety    CHF (congestive heart failure) (HCC)    COPD (chronic obstructive pulmonary disease) (HCC)    Depression    GERD (gastroesophageal reflux disease)    Hypertension    Osteopenia    PVC (premature ventricular contraction)    Rheumatoid aortitis    Seasonal allergies    Systolic heart failure, chronic (HCC)    Thyroid disease    Tuberculosis    as child. received tx.   Vertigo    no episodes for several months    There are no problems to display for this patient.   Past Surgical History:  Procedure Laterality Date   ABDOMINAL HYSTERECTOMY     APPENDECTOMY     BACK SURGERY     L5   CARDIAC CATHETERIZATION  05/2017   CARDIAC ELECTROPHYSIOLOGY MAPPING AND ABLATION  03/12/2018   CATARACT EXTRACTION W/PHACO Right 07/10/2021   Procedure: CATARACT EXTRACTION PHACO AND INTRAOCULAR LENS PLACEMENT (IOC) RIGHT 7.69 00:47.2;  Surgeon: Galen Manila, MD;  Location: [facility];  Service: Ophthalmology;  Laterality: Right;   CATARACT EXTRACTION W/PHACO Left 07/24/2021   Procedure: CATARACT EXTRACTION PHACO AND INTRAOCULAR LENS PLACEMENT (IOC) LEFT 5.37 00:32.8;  Surgeon: Galen Manila, MD;  Location: [facility];  Service: Ophthalmology;  Laterality: Left;   TONSILLECTOMY       OB History   No obstetric history on file.      Home Medications    Prior to Admission medications   Medication Sig Start Date End Date Taking? Authorizing Provider  albuterol (VENTOLIN HFA) 108 (90 Base) MCG/ACT inhaler Inhale into the lungs. 09/24/18  Yes [provider]  alendronate (FOSAMAX) 70 MG tablet Take by mouth. 07/01/18  Yes [provider]  baclofen (LIORESAL) 10 MG tablet TAKE 1 TABLET THREE TIMES DAILY AS NEEDED FOR MUSCLE SPASM(S) 07/27/19  Yes [provider]  benzonatate (TESSALON) 100 MG capsule Take 2 capsules (200 mg total) by mouth every 8 (eight) hours. 01/10/22  Yes Becky Augusta, NP  budesonide-formoterol [facility]) 160-4.5 MCG/ACT inhaler Inhale into the lungs.   Yes [provider]  cetirizine (ZYRTEC) 10 MG tablet Take 10 mg by mouth daily. 04/01/19 01/10/22 Yes [provider]  fluticasone (FLONASE) 50 MCG/ACT nasal spray Frequency:PRN   Dosage:0.0     Instructions:  Note:Dose: 2SPRY EACH NOSTIRL 04/23/11  Yes [provider]  folic acid (FOLVITE) 1 MG tablet Take by mouth. 09/02/18  Yes [provider]  furosemide (LASIX) 20 MG tablet Take by mouth. 07/09/18  Yes [provider]  gabapentin (NEURONTIN) 300 MG capsule TAKE 2 CAPSULES EVERY NIGHT 03/11/19  Yes [provider]  levofloxacin (LEVAQUIN) 500 MG tablet Take 1 tablet (500 mg total) by mouth daily. 01/10/22  Yes Becky Augustayan, Jannette Cotham, NP  levothyroxine (SYNTHROID) 112 MCG tablet Take by mouth. 06/18/19 01/10/22 Yes [provider]  lisinopril (ZESTRIL) 5 MG tablet TAKE 1 TABLET BY MOUTH EVERY DAY 07/27/19  Yes [provider]  methotrexate (RHEUMATREX) 2.5 MG tablet TAKE 8 TABLETS ONE TIME WEEKLY 01/15/19  Yes [provider]  metoprolol succinate (TOPROL-XL) 25 MG 24 hr tablet Take by mouth. 12/03/18 01/10/22 Yes [provider]  omeprazole (PRILOSEC) 20 MG capsule TAKE 1 CAPSULE EVERY DAY 03/11/19  Yes [provider]  promethazine-dextromethorphan (PROMETHAZINE-DM) 6.25-15 MG/5ML syrup Take 5 mLs by mouth 4 (four) times daily as needed. 01/10/22  Yes Becky Augustayan, Maziah Smola, NP  sertraline (ZOLOFT) 100 MG tablet TAKE 1 AND 1/2 TABLETS EVERY DAY 03/11/19  Yes [provider]  Spacer/Aero-Holding Chambers (AEROCHAMBER MV) inhaler by Does not apply route. 11/19/18  Yes [provider]    Family History Family History  Family history unknown: Yes    Social History Social History   Tobacco Use   Smoking status: Former    Types: Cigarettes    Quit date: 08/26/1994    Years since quitting: 27.3   Smokeless tobacco: Never  Vaping Use   Vaping Use: Never used  Substance Use Topics   Alcohol use: Never   Drug use: Never     Allergies   Penicillins and Codeine   Review of Systems Review of Systems  Musculoskeletal:  Positive for arthralgias and joint swelling.  Skin:  Positive for color change.  Neurological:  Positive for numbness. Negative for weakness.  Hematological: Negative.   Psychiatric/Behavioral: Negative.      Physical Exam Triage Vital Signs ED Triage Vitals  Enc Vitals Group     BP 01/10/22 1750 (!) 129/58     Pulse --      Resp 01/10/22 1750 18     Temp 01/10/22 1750 98.3 F (36.8 C)     Temp Source 01/10/22 1750 Oral     SpO2 01/10/22 1750 98 %     Weight 01/10/22 1754 140 lb (63.5 kg)     Height 01/10/22 1754 5\' 5"  (1.651 m)     Head Circumference --      Peak Flow --      Pain Score 01/10/22 1753 6     Pain Loc --      Pain Edu? --      Excl. in GC? --    No data found.  Updated Vital Signs BP (!) 129/58 (BP Location: Left Arm)    Temp 98.3 F (36.8 C) (Oral)    Resp 18    Ht 5\' 5"  (1.651 m)    Wt 140 lb (63.5 kg)    SpO2 98%    BMI 23.30 kg/m   Visual Acuity Right Eye Distance:   Left Eye Distance:   Bilateral Distance:    Right Eye Near:   Left Eye Near:    Bilateral Near:     Physical Exam Vitals and nursing note reviewed.   Constitutional:      Appearance: Normal appearance. She is not ill-appearing.  HENT:     Head: Normocephalic and atraumatic.  Cardiovascular:     Rate and Rhythm: Normal rate and regular rhythm.     Pulses: Normal pulses.     Heart sounds: Normal heart sounds. No murmur heard.   No friction rub. No gallop.  Pulmonary:  Effort: Pulmonary effort is normal.     Breath sounds: Normal breath sounds. No wheezing, rhonchi or rales.  Musculoskeletal:        General: Swelling and tenderness present. No deformity. Normal range of motion.  Skin:    General: Skin is warm and dry.     Capillary Refill: Capillary refill takes less than 2 seconds.     Findings: Bruising present. No erythema.  Neurological:     General: No focal deficit present.     Mental Status: She is alert and oriented to person, place, and time.  Psychiatric:        Mood and Affect: Mood normal.        Behavior: Behavior normal.        Thought Content: Thought content normal.        Judgment: Judgment normal.     UC Treatments / Results  Labs (all labs ordered are listed, but only abnormal results are displayed) Labs Reviewed - No data to display  EKG   Radiology DG Chest 2 View  Result Date: 01/10/2022 CLINICAL DATA:  Cough EXAM: CHEST - 2 VIEW COMPARISON:  None. FINDINGS: There is hyperinflation of the lungs compatible with COPD. Heart and mediastinal contours are within normal limits. No focal opacities or effusions. No acute bony abnormality. IMPRESSION: Hyperinflation/COPD.  No active cardiopulmonary disease. Electronically Signed   By: Charlett Nose M.D.   On: 01/10/2022 18:58   DG Foot Complete Left  Result Date: 01/10/2022 CLINICAL DATA:  Pt reports rolled out of bed last night and hurt her left foot, foot landed on floor. Pain to left lateral side of foot. No bruising noted. Able to apply weight and ambulate to room with husband's cane. Slight swelling noted. .*comment was truncated*pain after rolling  foot. EXAM: LEFT FOOT - COMPLETE 3+ VIEW COMPARISON:  None. FINDINGS: No fracture or dislocation of mid foot or forefoot. The phalanges are normal. The calcaneus is normal. No soft tissue abnormality. IMPRESSION: No fracture or dislocation. Electronically Signed   By: Genevive Bi M.D.   On: 01/10/2022 18:38    Procedures Procedures (including critical care time)  Medications Ordered in UC Medications - No data to display  Initial Impression / Assessment and Plan / UC Course  I have reviewed the triage vital signs and the nursing notes.  Pertinent labs & imaging results that were available during my care of the patient were reviewed by me and considered in my medical decision making (see chart for details).  Patient is a nontoxic-appearing 71 year old female here for evaluation of pain in her left fifth toe and along the lateral edge of her distal left foot that has been present since last night after falling out of bed and hitting the floor.  She states her toes feel that they are numb.  She is able to ambulate in the heel but not bear full weight on her foot.  On exam her left foot is in normal anatomical alignment.  There is swelling at the MTP joint of the left fifth toe as well as ecchymosis.  There is also ecchymosis along the leading edge of the foot.  DP and PT pulses are 2+.  Patient has sensation and full range of motion of her toes.  We will obtain radiograph of left foot to look for bony abnormality.  Patient also reporting a productive cough for the past 4 weeks to the x-ray tech.  Will obtain chest x-ray to look for acute injury or thoracic  process.  Cardiopulmonary exam reveals S1-S2 heart sounds without murmur, rub, or gallop.  Lung sounds are clear to auscultation in all fields.  X-ray of the left foot independently reviewed and evaluated by me.  Impression: No evidence of fracture or dislocation.  Radiology overread is pending.  Radiology states no evidence of fracture or  dislocation of the midfoot or forefoot.  The phalanges are normal.  The calcaneus is normal.  No soft tissue abnormality.  No fracture or dislocation.  Chest x-ray independently reviewed and evaluated by me.  Impression: Lung spaces are well pneumatized.  No evidence of infiltrate or effusion.  Radiology overread is pending. Radiology impression states there is hyperinflation of the lungs compatible with COPD.  Heart mediastinal contours are within normal limits.  No focal opacities or effusions.  No acute bony abnormality.  I will discharge patient home with a diagnosis of COPD exacerbation as well as contusion of left foot.  I will place her on Levaquin 500 mg daily for 7 days for treatment of her COPD exacerbation and provide Tessalon Perles and Promethazine DM cough syrup to help with cough and congestion.  For the left foot contusion I will place the patient in a postop shoe and have her use over-the-counter Tylenol Corte to the package instructions as needed for pain.  Also keep her foot elevated is much as possible and apply ice for 20s at a time 2-3 times a day to help with pain and inflammation.  If her symptoms not improved she is to follow-up with podiatry.   Final Clinical Impressions(s) / UC Diagnoses   Final diagnoses:  Contusion of left foot including toes, initial encounter  COPD exacerbation (HCC)     Discharge Instructions      Take the Levaquin 500 mg daily for 7 days for treatment of your COPD exacerbation.  Use your albuterol inhaler, 2 puffs every 4-6 hours as needed for shortness breath or wheezing.  Use the Tessalon Perles every 8 hours as needed for cough during the day.  They will sometimes cause numbness to the base of your tongue or give you metallic taste in her mouth.  This is normal.  You will need to take them with a small sip of water.  Use the Promethazine DM cough syrup at bedtime as needed for cough and congestion.  For your bruised foot please wear  the postop shoe to provide extra support and protect your foot from further injury.  Keep your left foot elevated is much as possible to decrease swelling and aid in pain relief.  You may apply ice to your foot for 20 minutes at a time 2-3 times a day, do not apply the ice directly to your skin, to help with pain and inflammation.  If your symptoms do not improve you should follow-up with podiatry.  Return for reevaluation for new or worsening symptoms.      ED Prescriptions     Medication Sig Dispense Auth. Provider   levofloxacin (LEVAQUIN) 500 MG tablet Take 1 tablet (500 mg total) by mouth daily. 7 tablet Becky Augusta, NP   benzonatate (TESSALON) 100 MG capsule Take 2 capsules (200 mg total) by mouth every 8 (eight) hours. 21 capsule Becky Augusta, NP   promethazine-dextromethorphan (PROMETHAZINE-DM) 6.25-15 MG/5ML syrup Take 5 mLs by mouth 4 (four) times daily as needed. 118 mL Becky Augusta, NP      PDMP not reviewed this encounter.   Becky Augusta, NP 01/10/22 1911    Becky Augusta,  NP 01/10/22 1916

## 2022-01-10 NOTE — Discharge Instructions (Addendum)
Take the Levaquin 500 mg daily for 7 days for treatment of your COPD exacerbation. ? ?Use your albuterol inhaler, 2 puffs every 4-6 hours as needed for shortness breath or wheezing. ? ?Use the Tessalon Perles every 8 hours as needed for cough during the day.  They will sometimes cause numbness to the base of your tongue or give you metallic taste in her mouth.  This is normal.  You will need to take them with a small sip of water. ? ?Use the Promethazine DM cough syrup at bedtime as needed for cough and congestion. ? ?For your bruised foot please wear the postop shoe to provide extra support and protect your foot from further injury. ? ?Keep your left foot elevated is much as possible to decrease swelling and aid in pain relief. ? ?You may apply ice to your foot for 20 minutes at a time 2-3 times a day, do not apply the ice directly to your skin, to help with pain and inflammation. ? ?If your symptoms do not improve you should follow-up with podiatry. ? ?Return for reevaluation for new or worsening symptoms.  ?

## 2022-01-17 DIAGNOSIS — D649 Anemia, unspecified: Secondary | ICD-10-CM | POA: Diagnosis not present

## 2022-01-17 DIAGNOSIS — Z5181 Encounter for therapeutic drug level monitoring: Secondary | ICD-10-CM | POA: Diagnosis not present

## 2022-01-17 DIAGNOSIS — Z79899 Other long term (current) drug therapy: Secondary | ICD-10-CM | POA: Diagnosis not present

## 2022-01-17 DIAGNOSIS — D696 Thrombocytopenia, unspecified: Secondary | ICD-10-CM | POA: Diagnosis not present

## 2022-01-17 DIAGNOSIS — M059 Rheumatoid arthritis with rheumatoid factor, unspecified: Secondary | ICD-10-CM | POA: Diagnosis not present

## 2022-01-18 DIAGNOSIS — I5022 Chronic systolic (congestive) heart failure: Secondary | ICD-10-CM | POA: Diagnosis not present

## 2022-01-18 DIAGNOSIS — I493 Ventricular premature depolarization: Secondary | ICD-10-CM | POA: Diagnosis not present

## 2023-03-26 IMAGING — CR DG FOOT COMPLETE 3+V*L*
3 series · 3 of 3 positions shown · non-contrast
Comparison: None.

CLINICAL DATA: Pt reports rolled out of bed last night and hurt her
left foot, foot landed on floor. Pain to left lateral side of foot.
No bruising noted. Able to apply weight and ambulate to room with
husband's cane. Slight swelling noted. .*comment was
truncated*pain after rolling foot.

EXAM:
LEFT FOOT - COMPLETE 3+ VIEW

[foot ap]
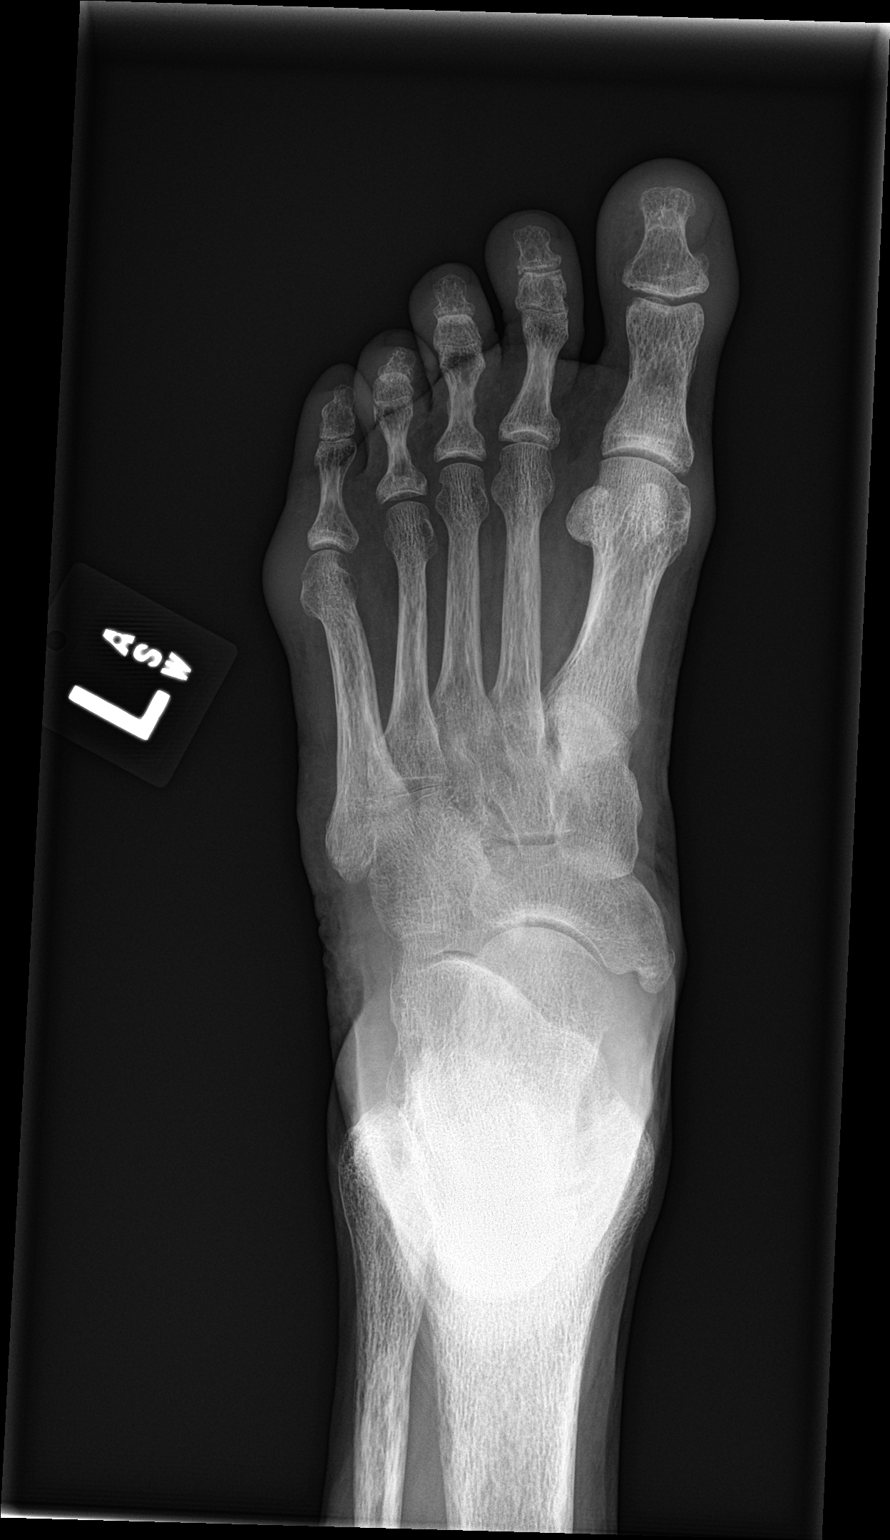

[foot obl]
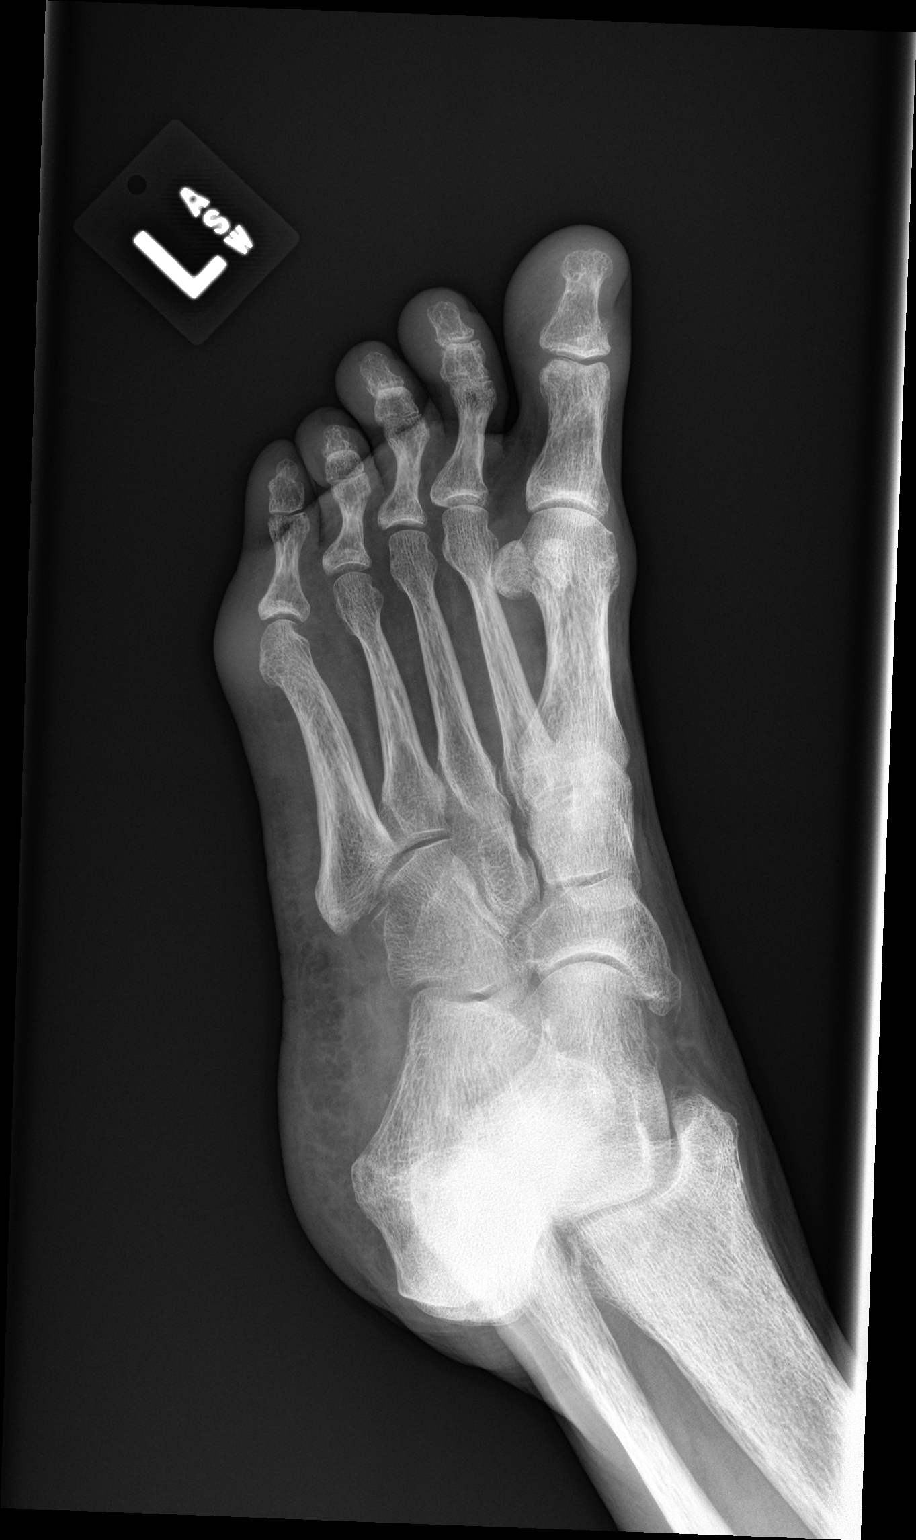

[foot lat]
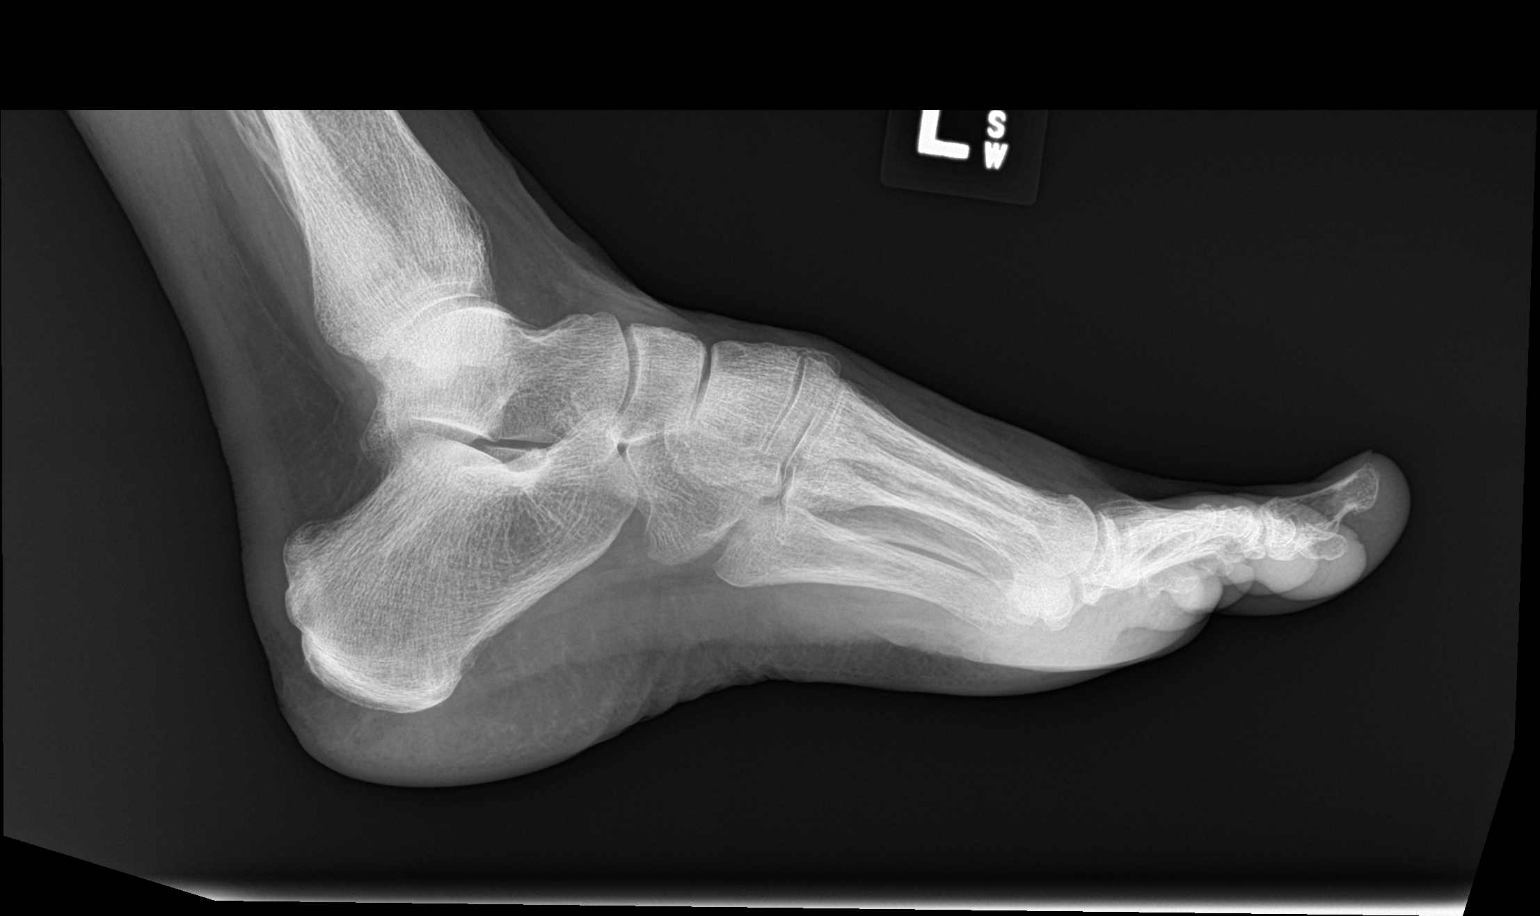

[3 of 3 positions shown; findings below may reference images not displayed]

FINDINGS: No fracture or dislocation of mid foot or forefoot. The phalanges
are normal. The calcaneus is normal. No soft tissue abnormality.
IMPRESSION: No fracture or dislocation.

## 2023-03-26 IMAGING — CR DG CHEST 2V
3 series · 3 of 3 positions shown · non-contrast
Comparison: None.

CLINICAL DATA: Cough

EXAM:
CHEST - 2 VIEW

[chest pa (1 of 2)]
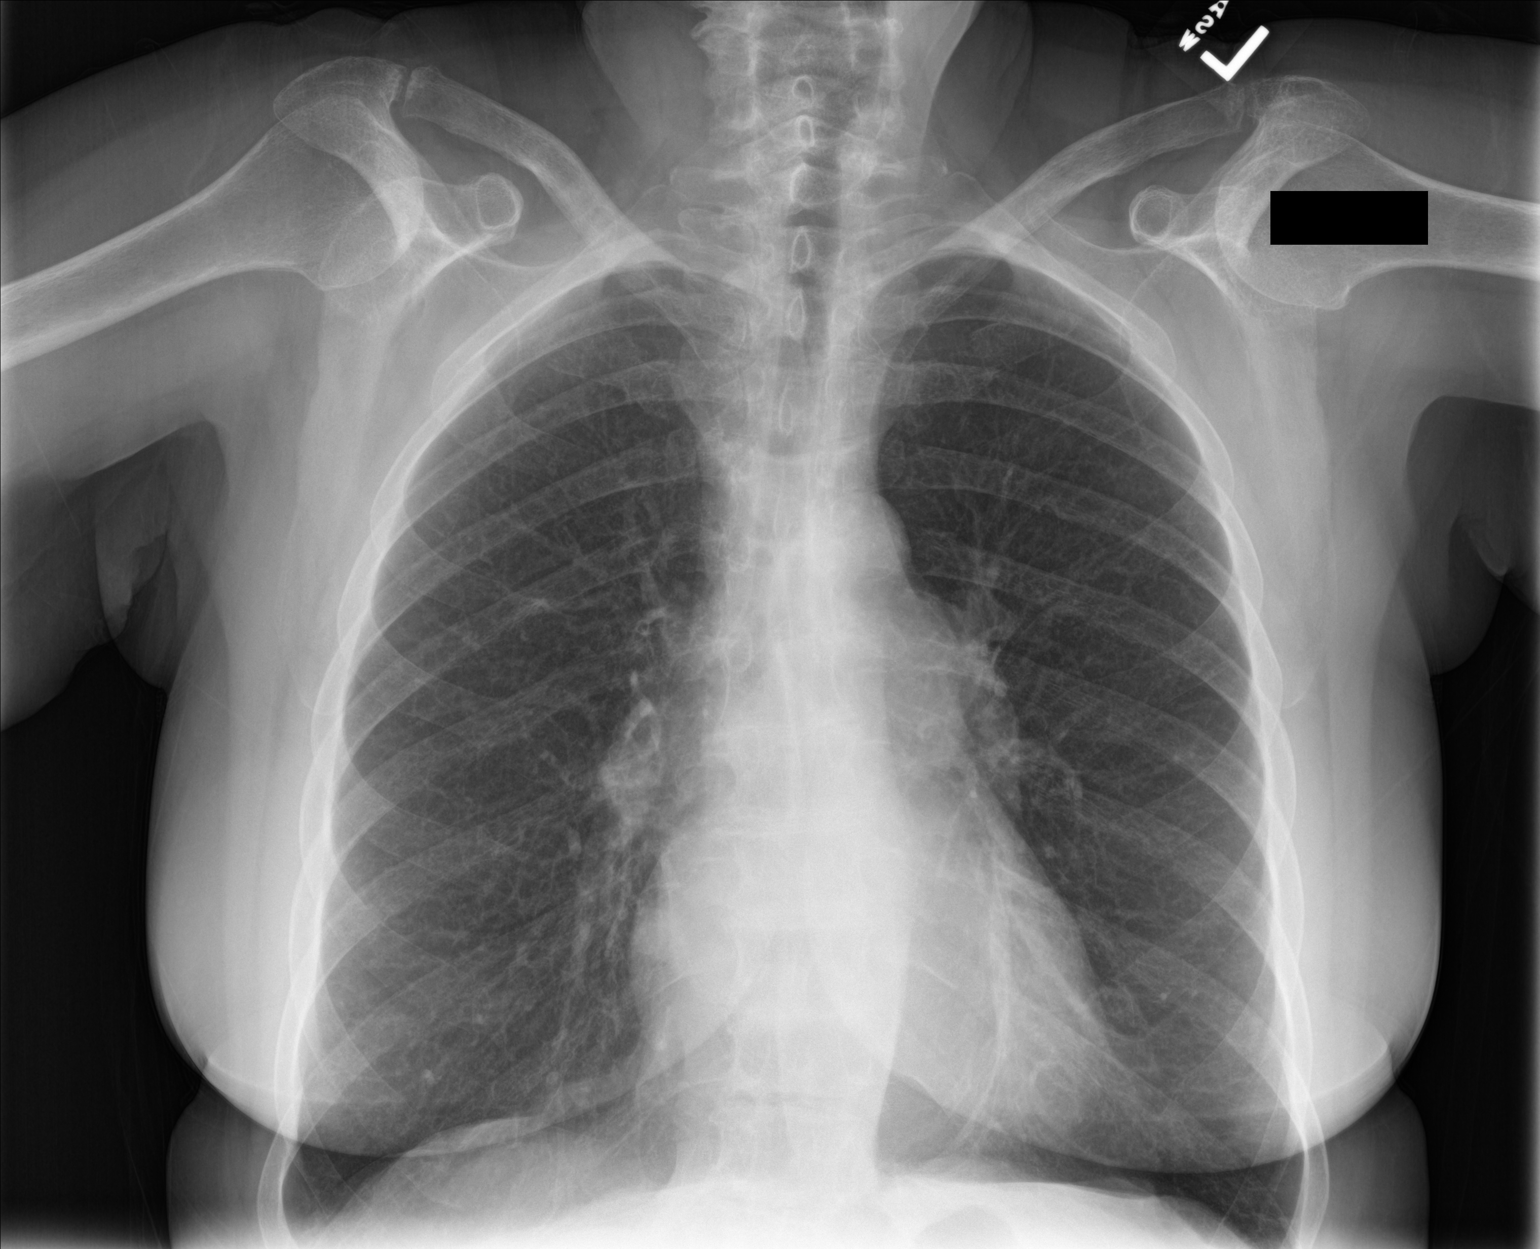

[chest lat]
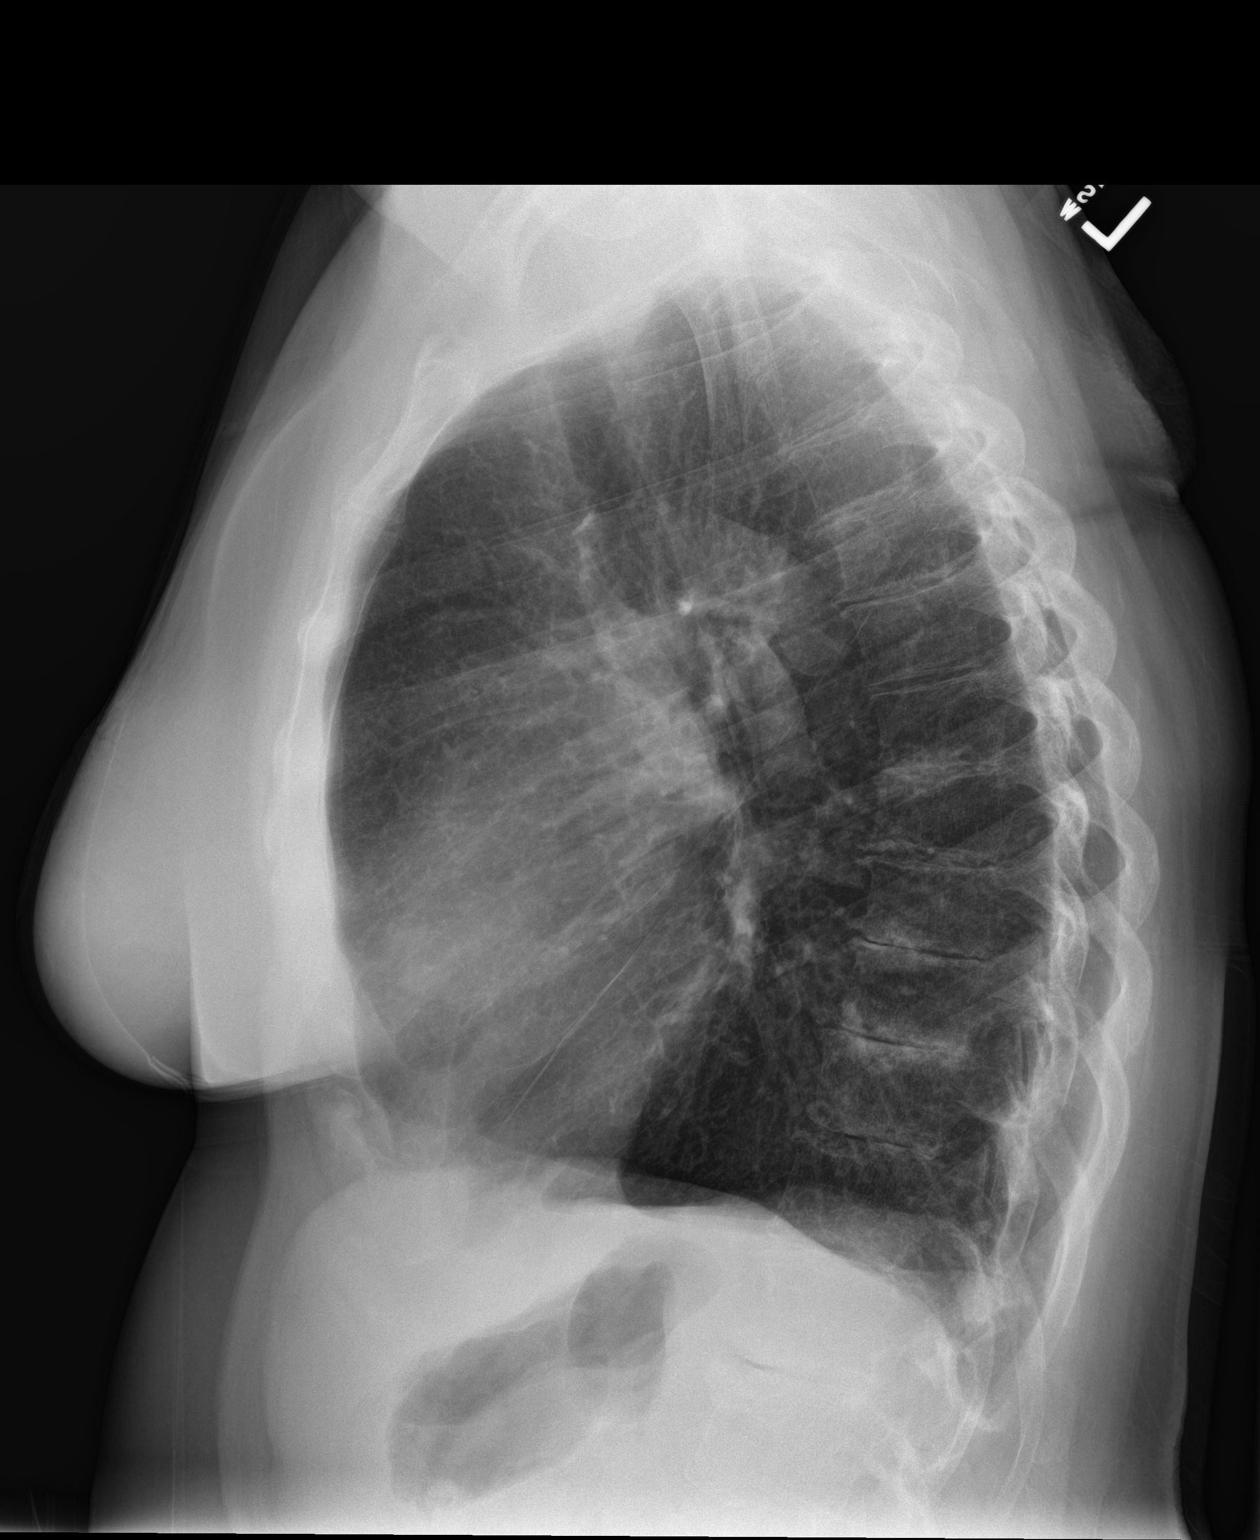

[chest pa (2 of 2)]
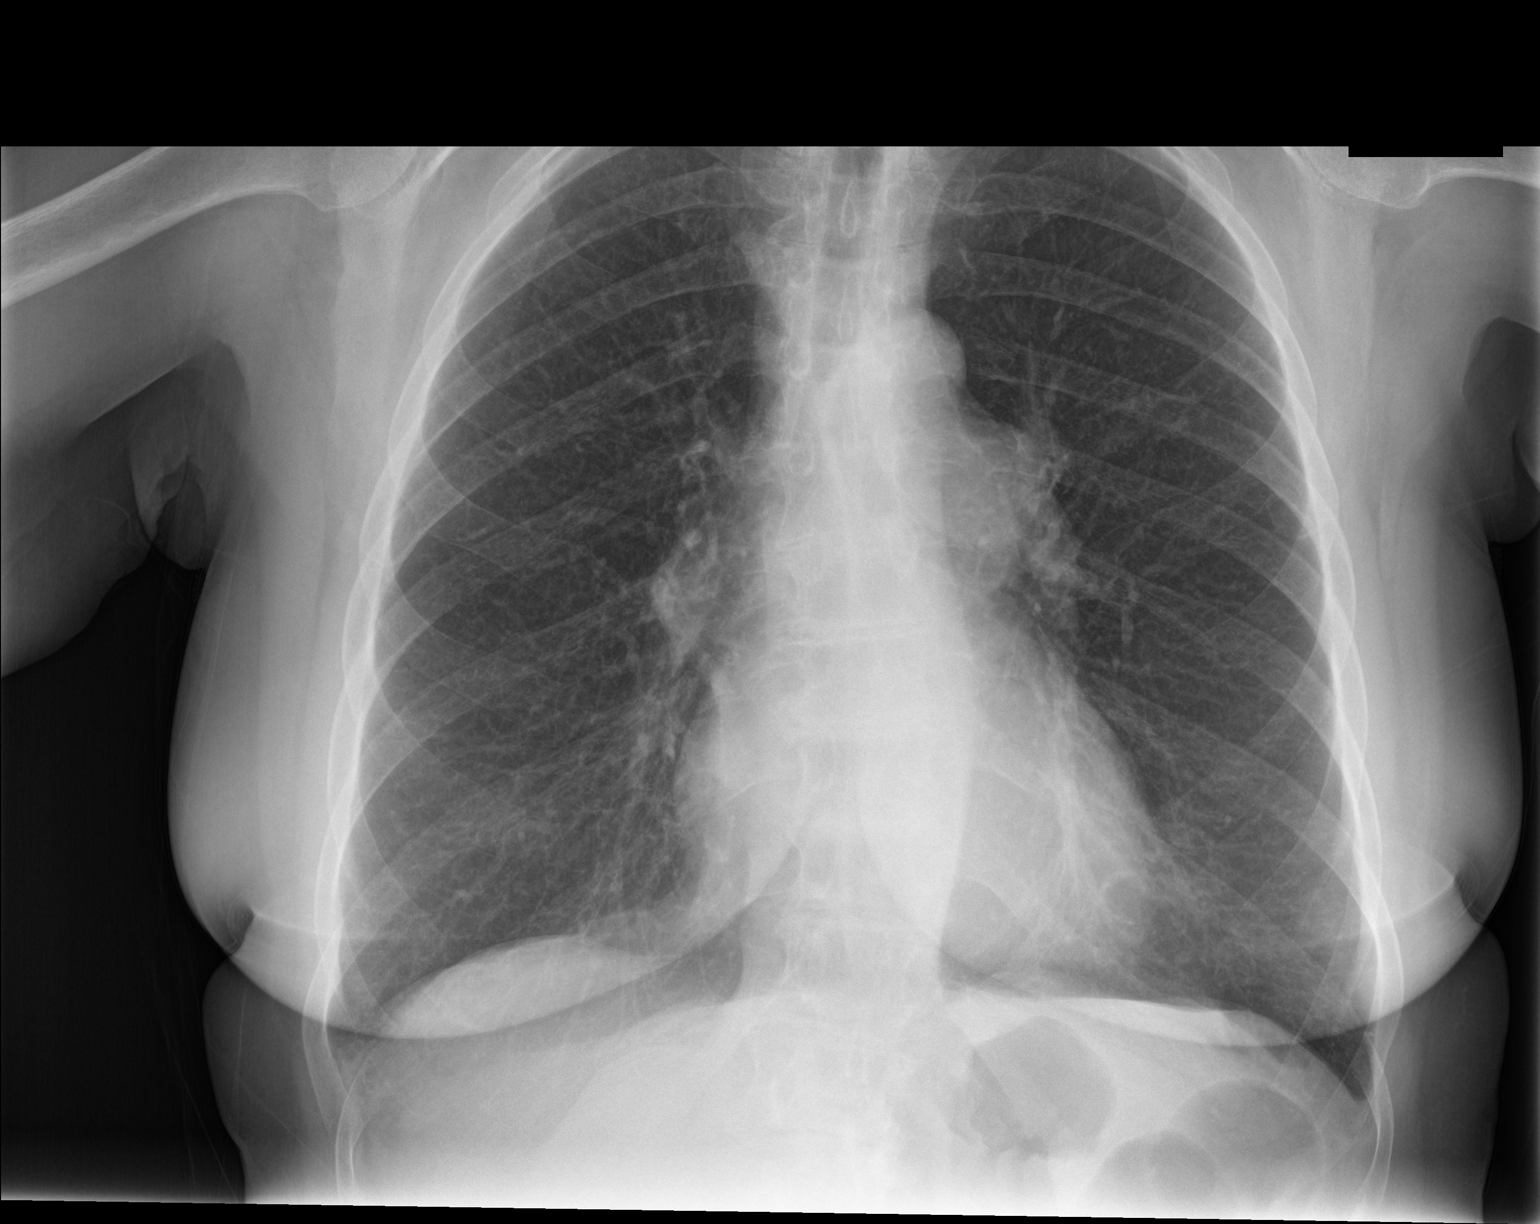

[3 of 3 positions shown; findings below may reference images not displayed]

FINDINGS: There is hyperinflation of the lungs compatible with COPD. Heart and
mediastinal contours are within normal limits. No focal opacities or
effusions. No acute bony abnormality.
IMPRESSION: Hyperinflation/COPD.  No active cardiopulmonary disease.
